# Patient Record
Sex: Female | Born: 1940 | Race: Black or African American | Hispanic: No | Smoking: Never smoker
Health system: Southern US, Community
[De-identification: ages and names within clinical notes are randomized; demographics above are authoritative.]

## PROBLEM LIST (undated history)

## (undated) DIAGNOSIS — I10 Essential (primary) hypertension: Secondary | ICD-10-CM

## (undated) DIAGNOSIS — F329 Major depressive disorder, single episode, unspecified: Secondary | ICD-10-CM

## (undated) DIAGNOSIS — E785 Hyperlipidemia, unspecified: Secondary | ICD-10-CM

## (undated) DIAGNOSIS — B029 Zoster without complications: Secondary | ICD-10-CM

## (undated) DIAGNOSIS — E119 Type 2 diabetes mellitus without complications: Secondary | ICD-10-CM

## (undated) DIAGNOSIS — M25569 Pain in unspecified knee: Secondary | ICD-10-CM

## (undated) DIAGNOSIS — G8929 Other chronic pain: Secondary | ICD-10-CM

## (undated) DIAGNOSIS — F32A Depression, unspecified: Secondary | ICD-10-CM

---

## 2004-08-20 ENCOUNTER — Ambulatory Visit: Payer: Self-pay | Admitting: Family Medicine

## 2010-01-14 ENCOUNTER — Ambulatory Visit: Payer: Self-pay | Admitting: Family Medicine

## 2018-05-12 ENCOUNTER — Emergency Department
Admission: EM | Admit: 2018-05-12 | Discharge: 2018-05-12 | Disposition: A | Discharge status: Home / Self Care | Payer: Medicare Other | Attending: Emergency Medicine | Admitting: Emergency Medicine

## 2018-05-12 ENCOUNTER — Other Ambulatory Visit: Payer: Self-pay

## 2018-05-12 ENCOUNTER — Encounter: Payer: Self-pay | Admitting: Emergency Medicine

## 2018-05-12 DIAGNOSIS — E119 Type 2 diabetes mellitus without complications: Secondary | ICD-10-CM | POA: Insufficient documentation

## 2018-05-12 DIAGNOSIS — Y998 Other external cause status: Secondary | ICD-10-CM | POA: Diagnosis not present

## 2018-05-12 DIAGNOSIS — Z79899 Other long term (current) drug therapy: Secondary | ICD-10-CM | POA: Insufficient documentation

## 2018-05-12 DIAGNOSIS — T300 Burn of unspecified body region, unspecified degree: Secondary | ICD-10-CM

## 2018-05-12 DIAGNOSIS — F329 Major depressive disorder, single episode, unspecified: Secondary | ICD-10-CM | POA: Diagnosis not present

## 2018-05-12 DIAGNOSIS — I10 Essential (primary) hypertension: Secondary | ICD-10-CM | POA: Diagnosis not present

## 2018-05-12 DIAGNOSIS — T24012A Burn of unspecified degree of left thigh, initial encounter: Secondary | ICD-10-CM | POA: Diagnosis present

## 2018-05-12 DIAGNOSIS — E860 Dehydration: Secondary | ICD-10-CM

## 2018-05-12 DIAGNOSIS — T24111A Burn of first degree of right thigh, initial encounter: Secondary | ICD-10-CM | POA: Diagnosis not present

## 2018-05-12 DIAGNOSIS — Y929 Unspecified place or not applicable: Secondary | ICD-10-CM | POA: Diagnosis not present

## 2018-05-12 DIAGNOSIS — T24211A Burn of second degree of right thigh, initial encounter: Secondary | ICD-10-CM | POA: Diagnosis not present

## 2018-05-12 DIAGNOSIS — Y9389 Activity, other specified: Secondary | ICD-10-CM | POA: Diagnosis not present

## 2018-05-12 DIAGNOSIS — R627 Adult failure to thrive: Secondary | ICD-10-CM

## 2018-05-12 DIAGNOSIS — X100XXA Contact with hot drinks, initial encounter: Secondary | ICD-10-CM | POA: Insufficient documentation

## 2018-05-12 DIAGNOSIS — N390 Urinary tract infection, site not specified: Secondary | ICD-10-CM

## 2018-05-12 HISTORY — DX: Type 2 diabetes mellitus without complications: E11.9

## 2018-05-12 HISTORY — DX: Pain in unspecified knee: M25.569

## 2018-05-12 HISTORY — DX: Hyperlipidemia, unspecified: E78.5

## 2018-05-12 HISTORY — DX: Zoster without complications: B02.9

## 2018-05-12 HISTORY — DX: Major depressive disorder, single episode, unspecified: F32.9

## 2018-05-12 HISTORY — DX: Essential (primary) hypertension: I10

## 2018-05-12 HISTORY — DX: Other chronic pain: G89.29

## 2018-05-12 HISTORY — DX: Depression, unspecified: F32.A

## 2018-05-12 LAB — URINALYSIS, COMPLETE (UACMP) WITH MICROSCOPIC
BILIRUBIN URINE: NEGATIVE
Glucose, UA: NEGATIVE mg/dL
HGB URINE DIPSTICK: NEGATIVE
Ketones, ur: NEGATIVE mg/dL
NITRITE: NEGATIVE
PH: 7 (ref 5.0–8.0)
Protein, ur: 100 mg/dL — AB
SPECIFIC GRAVITY, URINE: 1.017 (ref 1.005–1.030)
WBC, UA: >50 WBC/hpf — ABNORMAL HIGH (ref 0–5)

## 2018-05-12 LAB — CBC
HEMATOCRIT: 30 % — AB (ref 35.0–47.0)
Hemoglobin: 9.6 g/dL — ABNORMAL LOW (ref 12.0–16.0)
MCH: 27 pg (ref 26.0–34.0)
MCHC: 31.9 g/dL — ABNORMAL LOW (ref 32.0–36.0)
MCV: 84.5 fL (ref 80.0–100.0)
PLATELETS: 323 10*3/uL (ref 150–440)
RBC: 3.55 MIL/uL — ABNORMAL LOW (ref 3.80–5.20)
RDW: 16.5 % — AB (ref 11.5–14.5)
WBC: 6.9 10*3/uL (ref 3.6–11.0)

## 2018-05-12 LAB — HEPATIC FUNCTION PANEL
ALK PHOS: 79 U/L (ref 38–126)
ALT: 10 U/L (ref 0–44)
AST: 22 U/L (ref 15–41)
Albumin: 2.7 g/dL — ABNORMAL LOW (ref 3.5–5.0)
BILIRUBIN TOTAL: 0.6 mg/dL (ref 0.3–1.2)
Bilirubin, Direct: <0.1 mg/dL (ref 0.0–0.2)
Total Protein: 8 g/dL (ref 6.5–8.1)

## 2018-05-12 LAB — BASIC METABOLIC PANEL
Anion gap: 12 (ref 5–15)
BUN: 38 mg/dL — AB (ref 8–23)
CO2: 27 mmol/L (ref 22–32)
CREATININE: 1.26 mg/dL — AB (ref 0.44–1.00)
Calcium: 8.7 mg/dL — ABNORMAL LOW (ref 8.9–10.3)
Chloride: 106 mmol/L (ref 98–111)
GFR calc Af Amer: 46 mL/min — ABNORMAL LOW (ref >60)
GFR, EST NON AFRICAN AMERICAN: 40 mL/min — AB (ref >60)
Glucose, Bld: 109 mg/dL — ABNORMAL HIGH (ref 70–99)
Potassium: 3.6 mmol/L (ref 3.5–5.1)
SODIUM: 145 mmol/L (ref 135–145)

## 2018-05-12 LAB — TROPONIN I: Troponin I: 0.03 ng/mL (ref <0.03)

## 2018-05-12 LAB — LACTIC ACID, PLASMA: LACTIC ACID, VENOUS: 1.9 mmol/L (ref 0.5–1.9)

## 2018-05-12 LAB — GLUCOSE, CAPILLARY: Glucose-Capillary: 104 mg/dL — ABNORMAL HIGH (ref 70–99)

## 2018-05-12 MED ORDER — SILVER SULFADIAZINE 1 % EX CREA
TOPICAL_CREAM | CUTANEOUS | Status: AC
  Filled 2018-05-12: qty 85

## 2018-05-12 MED ORDER — CEPHALEXIN 250 MG PO CAPS: 250.0000 mg | ORAL_CAPSULE | Freq: Three times a day (TID) | ORAL | 0 refills | Status: AC

## 2018-05-12 MED ORDER — SILVER SULFADIAZINE 1 % EX CREA
TOPICAL_CREAM | Freq: Once | CUTANEOUS | Status: AC
  Administered 2018-05-12: 19:00:00 via TOPICAL

## 2018-05-12 MED ORDER — ATORVASTATIN CALCIUM 40 MG PO TABS: 40.0000 mg | ORAL_TABLET | Freq: Every day | ORAL | 1 refills | Status: AC

## 2018-05-12 MED ORDER — SODIUM CHLORIDE 0.9 % IV SOLN
1.0000 g | INTRAVENOUS | Status: AC
  Administered 2018-05-12: 1 g via INTRAVENOUS
  Filled 2018-05-12: qty 10

## 2018-05-12 MED ORDER — HYDRALAZINE HCL 50 MG PO TABS
25.0000 mg | ORAL_TABLET | Freq: Once | ORAL | Status: AC
  Administered 2018-05-12: 25 mg via ORAL
  Filled 2018-05-12: qty 1

## 2018-05-12 MED ORDER — NIFEDIPINE ER 90 MG PO TB24: 90.0000 mg | ORAL_TABLET | Freq: Every day | ORAL | 1 refills | Status: AC

## 2018-05-12 MED ORDER — SODIUM CHLORIDE 0.9 % IV BOLUS
500.0000 mL | Freq: Once | INTRAVENOUS | Status: AC
  Administered 2018-05-12: 500 mL via INTRAVENOUS

## 2018-05-12 MED ORDER — NIFEDIPINE ER OSMOTIC RELEASE 30 MG PO TB24
90.0000 mg | ORAL_TABLET | Freq: Once | ORAL | Status: AC
  Administered 2018-05-12: 90 mg via ORAL
  Filled 2018-05-12: qty 1
  Filled 2018-05-12: qty 3

## 2018-05-12 MED ORDER — HYDRALAZINE HCL 25 MG PO TABS: 25.0000 mg | ORAL_TABLET | Freq: Two times a day (BID) | ORAL | 1 refills | Status: AC

## 2018-05-12 NOTE — ED Notes (Signed)
Pt is resting in bed with family at bedside. Respirations even/unlabored. nadn at this time.
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
## 2018-05-12 NOTE — ED Notes (Signed)
Pt also reports  Weakness and  Decreased appetite  Denies any chest pain
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
## 2018-05-12 NOTE — ED Notes (Signed)
Dr Vincent Peyer notified of hypertension

## 2018-05-12 NOTE — ED Notes (Signed)
DR Karle Starch NOTIFIED OF ELEVATED TROPONIN

## 2018-05-14 LAB — URINE CULTURE
Culture: 50000 — AB
Special Requests: NORMAL

## 2018-06-16 ENCOUNTER — Other Ambulatory Visit: Payer: Self-pay | Admitting: Family Medicine

## 2018-06-16 DIAGNOSIS — M25511 Pain in right shoulder: Secondary | ICD-10-CM

## 2018-06-23 ENCOUNTER — Ambulatory Visit
Admission: RE | Admit: 2018-06-23 | Discharge: 2018-06-23 | Disposition: A | Discharge status: Home / Self Care | Payer: Medicare Other | Source: Ambulatory Visit | Attending: Family Medicine | Admitting: Family Medicine

## 2018-06-23 DIAGNOSIS — M25511 Pain in right shoulder: Secondary | ICD-10-CM | POA: Insufficient documentation

## 2018-06-23 DIAGNOSIS — M19011 Primary osteoarthritis, right shoulder: Secondary | ICD-10-CM | POA: Insufficient documentation

## 2018-08-08 ENCOUNTER — Emergency Department: Payer: Medicare Other

## 2018-08-08 ENCOUNTER — Encounter: Payer: Self-pay | Admitting: Emergency Medicine

## 2018-08-08 ENCOUNTER — Other Ambulatory Visit: Payer: Self-pay

## 2018-08-08 ENCOUNTER — Inpatient Hospital Stay
Admission: EM | Admit: 2018-08-08 | Discharge: 2018-08-30 | DRG: 064 | Disposition: E | Payer: Medicare Other | Attending: Pulmonary Disease | Admitting: Pulmonary Disease

## 2018-08-08 DIAGNOSIS — W06XXXA Fall from bed, initial encounter: Secondary | ICD-10-CM | POA: Diagnosis present

## 2018-08-08 DIAGNOSIS — R29716 NIHSS score 16: Secondary | ICD-10-CM | POA: Diagnosis not present

## 2018-08-08 DIAGNOSIS — R918 Other nonspecific abnormal finding of lung field: Secondary | ICD-10-CM | POA: Diagnosis not present

## 2018-08-08 DIAGNOSIS — R001 Bradycardia, unspecified: Secondary | ICD-10-CM | POA: Diagnosis not present

## 2018-08-08 DIAGNOSIS — Z681 Body mass index (BMI) 19 or less, adult: Secondary | ICD-10-CM

## 2018-08-08 DIAGNOSIS — I639 Cerebral infarction, unspecified: Secondary | ICD-10-CM | POA: Diagnosis present

## 2018-08-08 DIAGNOSIS — F329 Major depressive disorder, single episode, unspecified: Secondary | ICD-10-CM | POA: Diagnosis present

## 2018-08-08 DIAGNOSIS — D491 Neoplasm of unspecified behavior of respiratory system: Secondary | ICD-10-CM

## 2018-08-08 DIAGNOSIS — R9401 Abnormal electroencephalogram [EEG]: Secondary | ICD-10-CM | POA: Diagnosis present

## 2018-08-08 DIAGNOSIS — I671 Cerebral aneurysm, nonruptured: Secondary | ICD-10-CM | POA: Diagnosis present

## 2018-08-08 DIAGNOSIS — R29719 NIHSS score 19: Secondary | ICD-10-CM | POA: Diagnosis present

## 2018-08-08 DIAGNOSIS — Z8679 Personal history of other diseases of the circulatory system: Secondary | ICD-10-CM

## 2018-08-08 DIAGNOSIS — R29724 NIHSS score 24: Secondary | ICD-10-CM | POA: Diagnosis not present

## 2018-08-08 DIAGNOSIS — C781 Secondary malignant neoplasm of mediastinum: Secondary | ICD-10-CM | POA: Diagnosis present

## 2018-08-08 DIAGNOSIS — Z66 Do not resuscitate: Secondary | ICD-10-CM | POA: Diagnosis not present

## 2018-08-08 DIAGNOSIS — D649 Anemia, unspecified: Secondary | ICD-10-CM | POA: Diagnosis present

## 2018-08-08 DIAGNOSIS — R059 Cough, unspecified: Secondary | ICD-10-CM

## 2018-08-08 DIAGNOSIS — J9601 Acute respiratory failure with hypoxia: Secondary | ICD-10-CM | POA: Diagnosis not present

## 2018-08-08 DIAGNOSIS — E43 Unspecified severe protein-calorie malnutrition: Secondary | ICD-10-CM

## 2018-08-08 DIAGNOSIS — Z823 Family history of stroke: Secondary | ICD-10-CM

## 2018-08-08 DIAGNOSIS — L89152 Pressure ulcer of sacral region, stage 2: Secondary | ICD-10-CM | POA: Diagnosis present

## 2018-08-08 DIAGNOSIS — I6200 Nontraumatic subdural hemorrhage, unspecified: Secondary | ICD-10-CM | POA: Diagnosis present

## 2018-08-08 DIAGNOSIS — C3492 Malignant neoplasm of unspecified part of left bronchus or lung: Secondary | ICD-10-CM | POA: Diagnosis present

## 2018-08-08 DIAGNOSIS — M19011 Primary osteoarthritis, right shoulder: Secondary | ICD-10-CM | POA: Diagnosis present

## 2018-08-08 DIAGNOSIS — E785 Hyperlipidemia, unspecified: Secondary | ICD-10-CM | POA: Diagnosis present

## 2018-08-08 DIAGNOSIS — R569 Unspecified convulsions: Secondary | ICD-10-CM | POA: Diagnosis not present

## 2018-08-08 DIAGNOSIS — R29898 Other symptoms and signs involving the musculoskeletal system: Secondary | ICD-10-CM

## 2018-08-08 DIAGNOSIS — S065XAA Traumatic subdural hemorrhage with loss of consciousness status unknown, initial encounter: Secondary | ICD-10-CM

## 2018-08-08 DIAGNOSIS — R05 Cough: Secondary | ICD-10-CM

## 2018-08-08 DIAGNOSIS — E860 Dehydration: Secondary | ICD-10-CM | POA: Diagnosis present

## 2018-08-08 DIAGNOSIS — Z515 Encounter for palliative care: Secondary | ICD-10-CM

## 2018-08-08 DIAGNOSIS — C7801 Secondary malignant neoplasm of right lung: Secondary | ICD-10-CM | POA: Diagnosis present

## 2018-08-08 DIAGNOSIS — R531 Weakness: Secondary | ICD-10-CM

## 2018-08-08 DIAGNOSIS — Z4659 Encounter for fitting and adjustment of other gastrointestinal appliance and device: Secondary | ICD-10-CM

## 2018-08-08 DIAGNOSIS — C3491 Malignant neoplasm of unspecified part of right bronchus or lung: Secondary | ICD-10-CM | POA: Diagnosis present

## 2018-08-08 DIAGNOSIS — G8191 Hemiplegia, unspecified affecting right dominant side: Secondary | ICD-10-CM | POA: Diagnosis present

## 2018-08-08 DIAGNOSIS — J90 Pleural effusion, not elsewhere classified: Secondary | ICD-10-CM | POA: Diagnosis present

## 2018-08-08 DIAGNOSIS — Z8249 Family history of ischemic heart disease and other diseases of the circulatory system: Secondary | ICD-10-CM

## 2018-08-08 DIAGNOSIS — R579 Shock, unspecified: Secondary | ICD-10-CM | POA: Diagnosis not present

## 2018-08-08 DIAGNOSIS — I10 Essential (primary) hypertension: Secondary | ICD-10-CM | POA: Diagnosis present

## 2018-08-08 DIAGNOSIS — N390 Urinary tract infection, site not specified: Secondary | ICD-10-CM | POA: Diagnosis present

## 2018-08-08 DIAGNOSIS — S065X9A Traumatic subdural hemorrhage with loss of consciousness of unspecified duration, initial encounter: Secondary | ICD-10-CM

## 2018-08-08 DIAGNOSIS — R4182 Altered mental status, unspecified: Secondary | ICD-10-CM | POA: Diagnosis present

## 2018-08-08 DIAGNOSIS — C7802 Secondary malignant neoplasm of left lung: Secondary | ICD-10-CM | POA: Diagnosis present

## 2018-08-08 DIAGNOSIS — Z79899 Other long term (current) drug therapy: Secondary | ICD-10-CM

## 2018-08-08 DIAGNOSIS — L8990 Pressure ulcer of unspecified site, unspecified stage: Secondary | ICD-10-CM

## 2018-08-08 DIAGNOSIS — R29810 Facial weakness: Secondary | ICD-10-CM | POA: Diagnosis present

## 2018-08-08 DIAGNOSIS — R4701 Aphasia: Secondary | ICD-10-CM | POA: Diagnosis present

## 2018-08-08 DIAGNOSIS — I469 Cardiac arrest, cause unspecified: Secondary | ICD-10-CM | POA: Diagnosis not present

## 2018-08-08 DIAGNOSIS — Z993 Dependence on wheelchair: Secondary | ICD-10-CM

## 2018-08-08 DIAGNOSIS — I361 Nonrheumatic tricuspid (valve) insufficiency: Secondary | ICD-10-CM | POA: Diagnosis not present

## 2018-08-08 DIAGNOSIS — Z886 Allergy status to analgesic agent status: Secondary | ICD-10-CM

## 2018-08-08 DIAGNOSIS — F0390 Unspecified dementia without behavioral disturbance: Secondary | ICD-10-CM | POA: Diagnosis present

## 2018-08-08 LAB — CBC WITH DIFFERENTIAL/PLATELET
Abs Immature Granulocytes: 0.04 10*3/uL (ref 0.00–0.07)
BASOS PCT: 0 %
Basophils Absolute: 0 10*3/uL (ref 0.0–0.1)
EOS ABS: 0 10*3/uL (ref 0.0–0.5)
Eosinophils Relative: 0 %
HEMATOCRIT: 28.5 % — AB (ref 36.0–46.0)
Hemoglobin: 8.6 g/dL — ABNORMAL LOW (ref 12.0–15.0)
Immature Granulocytes: 1 %
Lymphocytes Relative: 17 %
Lymphs Abs: 1.2 10*3/uL (ref 0.7–4.0)
MCH: 26 pg (ref 26.0–34.0)
MCHC: 30.2 g/dL (ref 30.0–36.0)
MCV: 86.1 fL (ref 80.0–100.0)
MONO ABS: 0.8 10*3/uL (ref 0.1–1.0)
MONOS PCT: 11 %
NEUTROS PCT: 71 %
Neutro Abs: 5.1 10*3/uL (ref 1.7–7.7)
PLATELETS: 388 10*3/uL (ref 150–400)
RBC: 3.31 MIL/uL — ABNORMAL LOW (ref 3.87–5.11)
RDW: 17.2 % — AB (ref 11.5–15.5)
WBC: 7.2 10*3/uL (ref 4.0–10.5)
nRBC: 0 % (ref 0.0–0.2)

## 2018-08-08 LAB — COMPREHENSIVE METABOLIC PANEL
ALK PHOS: 73 U/L (ref 38–126)
ALT: 14 U/L (ref 0–44)
ANION GAP: 11 (ref 5–15)
AST: 27 U/L (ref 15–41)
Albumin: 2.5 g/dL — ABNORMAL LOW (ref 3.5–5.0)
BUN: 31 mg/dL — ABNORMAL HIGH (ref 8–23)
CALCIUM: 8.3 mg/dL — AB (ref 8.9–10.3)
CO2: 24 mmol/L (ref 22–32)
Chloride: 101 mmol/L (ref 98–111)
Creatinine, Ser: 0.84 mg/dL (ref 0.44–1.00)
GFR calc non Af Amer: >60 mL/min (ref >60)
Glucose, Bld: 77 mg/dL (ref 70–99)
Potassium: 3.7 mmol/L (ref 3.5–5.1)
SODIUM: 136 mmol/L (ref 135–145)
TOTAL PROTEIN: 8.1 g/dL (ref 6.5–8.1)
Total Bilirubin: 0.7 mg/dL (ref 0.3–1.2)

## 2018-08-08 LAB — URINALYSIS, COMPLETE (UACMP) WITH MICROSCOPIC
BACTERIA UA: NONE SEEN
Bilirubin Urine: NEGATIVE
Glucose, UA: NEGATIVE mg/dL
Hgb urine dipstick: NEGATIVE
Ketones, ur: 5 mg/dL — AB
Leukocytes, UA: NEGATIVE
Nitrite: NEGATIVE
PH: 6 (ref 5.0–8.0)
Protein, ur: 30 mg/dL — AB
Specific Gravity, Urine: 1.012 (ref 1.005–1.030)

## 2018-08-08 LAB — GLUCOSE, CAPILLARY: Glucose-Capillary: 70 mg/dL (ref 70–99)

## 2018-08-08 LAB — TROPONIN I: Troponin I: <0.03 ng/mL (ref <0.03)

## 2018-08-08 LAB — LACTIC ACID, PLASMA
LACTIC ACID, VENOUS: 1.9 mmol/L (ref 0.5–1.9)
LACTIC ACID, VENOUS: 1.3 mmol/L (ref 0.5–1.9)

## 2018-08-08 MED ORDER — SODIUM CHLORIDE 0.9 % IV BOLUS
500.0000 mL | Freq: Once | INTRAVENOUS | Status: AC
  Administered 2018-08-08: 500 mL via INTRAVENOUS

## 2018-08-08 MED ORDER — SODIUM CHLORIDE 0.9 % IV BOLUS
500.0000 mL | Freq: Once | INTRAVENOUS | Status: AC
  Administered 2018-08-08: 17:00:00 via INTRAVENOUS

## 2018-08-08 MED ORDER — DONEPEZIL HCL 5 MG PO TABS
10.0000 mg | ORAL_TABLET | Freq: Every day | ORAL | Status: DC
  Filled 2018-08-08 (×2): qty 2

## 2018-08-08 MED ORDER — ACETAMINOPHEN 160 MG/5ML PO SOLN
650.0000 mg | ORAL | Status: DC | PRN
  Filled 2018-08-08: qty 20.3

## 2018-08-08 MED ORDER — ACETAMINOPHEN 325 MG PO TABS: 650.0000 mg | ORAL_TABLET | ORAL | Status: DC | PRN

## 2018-08-08 MED ORDER — LEVETIRACETAM IN NACL 1000 MG/100ML IV SOLN
1000.0000 mg | Freq: Once | INTRAVENOUS | Status: AC
  Administered 2018-08-08: 1000 mg via INTRAVENOUS
  Filled 2018-08-08: qty 100

## 2018-08-08 MED ORDER — SODIUM CHLORIDE 0.9 % IV SOLN
INTRAVENOUS | Status: DC
  Administered 2018-08-09 (×3): via INTRAVENOUS

## 2018-08-08 MED ORDER — FENTANYL CITRATE (PF) 100 MCG/2ML IJ SOLN
12.5000 ug | Freq: Once | INTRAMUSCULAR | Status: AC
  Administered 2018-08-08: 12.5 ug via INTRAVENOUS
  Filled 2018-08-08: qty 2

## 2018-08-08 MED ORDER — FERROUS SULFATE 325 (65 FE) MG PO TABS: 325.0000 mg | ORAL_TABLET | Freq: Every day | ORAL | Status: DC

## 2018-08-08 MED ORDER — SODIUM CHLORIDE 0.9 % IV SOLN
500.0000 mg | Freq: Two times a day (BID) | INTRAVENOUS | Status: DC
  Administered 2018-08-09 (×2): 500 mg via INTRAVENOUS
  Filled 2018-08-08: qty 5
  Filled 2018-08-08: qty 525
  Filled 2018-08-08: qty 5
  Filled 2018-08-08: qty 525
  Filled 2018-08-08: qty 5

## 2018-08-08 MED ORDER — IOHEXOL 350 MG/ML SOLN
75.0000 mL | Freq: Once | INTRAVENOUS | Status: AC | PRN
  Administered 2018-08-08: 75 mL via INTRAVENOUS

## 2018-08-08 MED ORDER — SENNOSIDES-DOCUSATE SODIUM 8.6-50 MG PO TABS: 1.0000 | ORAL_TABLET | Freq: Every evening | ORAL | Status: DC | PRN

## 2018-08-08 MED ORDER — SODIUM CHLORIDE 0.9 % IV SOLN: 860.0000 mg | Freq: Once | INTRAVENOUS | Status: DC

## 2018-08-08 MED ORDER — MIRTAZAPINE 15 MG PO TABS: 7.5000 mg | ORAL_TABLET | Freq: Every day | ORAL | Status: DC

## 2018-08-08 MED ORDER — ATORVASTATIN CALCIUM 20 MG PO TABS: 40.0000 mg | ORAL_TABLET | Freq: Every day | ORAL | Status: DC

## 2018-08-08 MED ORDER — ONDANSETRON HCL 4 MG/2ML IJ SOLN
4.0000 mg | Freq: Once | INTRAMUSCULAR | Status: AC
  Administered 2018-08-08: 4 mg via INTRAVENOUS
  Filled 2018-08-08: qty 2

## 2018-08-08 MED ORDER — ACETAMINOPHEN 650 MG RE SUPP: 650.0000 mg | RECTAL | Status: DC | PRN

## 2018-08-08 MED ORDER — STROKE: EARLY STAGES OF RECOVERY BOOK
Freq: Once | Status: AC
  Administered 2018-08-08: 23:00:00
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
## 2018-08-08 NOTE — ED Triage Notes (Signed)
Pt in via [facility] from home.  Per EMS, pt with right side weakness, facial droop, aphasic.  Family reports last known well time 2030 last night, but also reported that she was unable to feed herself which is not her baseline.  Pt unable to communicate upon arrival.  Vitals WDL.  EDP to bedside.

## 2018-08-08 NOTE — H&P (Addendum)
[facility] NAME: Michaela Dunn    MR#:  [id number]  DATE OF BIRTH:  Oct 17, 1940  DATE OF ADMISSION:  08/10/2018  PRIMARY CARE PHYSICIAN: Marguerita Merles, MD   REQUESTING/REFERRING PHYSICIAN:   CHIEF COMPLAINT:   Chief Complaint  Patient presents with  . Cerebrovascular Accident    HISTORY OF PRESENT ILLNESS: Michaela Dunn  is a 77 y.o. female with a known history of diabetes, hypertension, hyperlipidemia. Michaela Dunn is not able to provide history, due to altered mental status.  Information was taken from reviewing the medical records and from discussion with emergency room physician and the family. Patient was brought to emergency room for altered mental status and right-sided weakness and a fall noted by the family today.  However, it is unclear when the symptoms actually started.  She was last seen well yesterday, although she has been noted with generalized weakness in the past 2 days.  On further questioning, the family admits that patient has lost 20 to 40 pounds in the past 6 months, due to poor appetite. Blood test done emergency room revealed low hemoglobin level at 8.6.  Albumin is 2.5.  Lactic acid level is 1.9. CTA of the head and neck shows: Negative for large vessel occlusion. But positive a for 4-5 mm left convexity Subdural Hematoma, which might explain an attenuated appearance of the left MCA branches - and could be a stroke mimic for symptoms in that hemisphere. 2. Positive also for partially visible bulky tumor in the mediastinum and bilateral pulmonary metastatic disease with large layering left pleural effusion. Lung cancer including small cell carcinoma would be a leading consideration. No strong evidence of metastatic disease to the brain. 3. 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 This patient has previously had a contralateral distal left ICA aneurysm clipped with no adverse features  identified there. 4. 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 And up to moderate stenosis of the left ICA siphon and both distal vertebral arteries.  Is admitted for further evaluation and treatment.  PAST MEDICAL HISTORY:   Past Medical History:  Diagnosis Date  . Depression   . Diabetes mellitus without complication ([place])   . Herpes zoster   . Hyperlipidemia   . Hypertension   . Knee pain, chronic     PAST SURGICAL HISTORY: History reviewed. No pertinent surgical history.  SOCIAL HISTORY:  Social History   Tobacco Use  . Smoking status: Never Smoker  . Smokeless tobacco: Never Used  Substance Use Topics  . Alcohol use: Not on file    FAMILY HISTORY: No family history on file.  DRUG ALLERGIES:  Allergies  Allergen Reactions  . Nsaids     REVIEW OF SYSTEMS:   Unable to obtain, due to patient's altered mental status.  MEDICATIONS AT HOME:  Prior to Admission medications   Medication Sig Start Date End Date Taking? Authorizing Provider  atorvastatin (LIPITOR) 40 MG tablet Take 1 tablet (40 mg total) by mouth daily. 05/12/18 08/10/2018 Yes Hinda Kehr, MD  donepezil (ARICEPT) 10 MG tablet Take 10 mg by mouth at bedtime. 07/19/18  Yes [provider]  FEROSUL 325 (65 Fe) MG tablet Take 325 mg by mouth daily. 07/13/18  Yes [provider]  hydrALAZINE (APRESOLINE) 25 MG tablet  Take 1 tablet (25 mg total) by mouth 2 (two) times daily. 05/12/18 08/05/2018 Yes Hinda Kehr, MD  lisinopril-hydrochlorothiazide (PRINZIDE,ZESTORETIC) 20-12.5 MG tablet Take 1 tablet by mouth daily. 07/13/18  Yes [provider]  mirtazapine (REMERON) 7.5 MG tablet Take 7.5 mg by mouth at bedtime. 07/19/18  Yes [provider]  NIFEdipine (ADALAT CC) 90 MG 24 hr tablet Take 1 tablet (90 mg  total) by mouth daily. 05/12/18 08/02/2018 Yes Hinda Kehr, MD      PHYSICAL EXAMINATION:   VITAL SIGNS: Blood pressure (!) 107/50, pulse 87, temperature 98.8 F (37.1 C), temperature source Rectal, resp. rate 17, height 5\' 3"  (1.6 m), weight 43.1 kg, SpO2 100 %.  GENERAL:  77 y.o.-year-old patient lying in the bed with no acute distress.  She looks lethargic, confused and cachectic. EYES: Pupils equal, round, reactive to light and accommodation. No scleral icterus.  HEENT: Head atraumatic, normocephalic. Oropharynx and nasopharynx clear.  NECK:  Supple, no jugular venous distention. No thyroid enlargement, no tenderness.  LUNGS: Reduced breath sounds bilaterally, no wheezing, rales,rhonchi or crepitation. No use of accessory muscles of respiration.  CARDIOVASCULAR: S1, S2 normal. No S3/S4.  ABDOMEN: Soft, nontender, nondistended. Bowel sounds present. No organomegaly or mass.  EXTREMITIES: No pedal edema, cyanosis, or clubbing.  NEUROLOGIC EXAM is limited, due to patient's altered mental status.  Aphasia as noted.  No facial droop.  Right-sided weakness is noted. PSYCHIATRIC: The patient is alert, but confused, lethargic.  SKIN: No obvious rash.  Stage II sacral ulcer is present.   LABORATORY PANEL:   CBC Recent Labs  Lab 08/16/2018 1413  WBC 7.2  HGB 8.6*  HCT 28.5*  PLT 388  MCV 86.1  MCH 26.0  MCHC 30.2  RDW 17.2*  LYMPHSABS 1.2  MONOABS 0.8  EOSABS 0.0  BASOSABS 0.0   ------------------------------------------------------------------------------------------------------------------  Chemistries  Recent Labs  Lab 07/30/2018 1413  NA 136  K 3.7  CL 101  CO2 24  GLUCOSE 77  BUN 31*  CREATININE 0.84  CALCIUM 8.3*  AST 27  ALT 14  ALKPHOS 73  BILITOT 0.7   ------------------------------------------------------------------------------------------------------------------ estimated creatinine clearance is 38.2 mL/min (by C-G formula based on SCr of 0.84  mg/dL). ------------------------------------------------------------------------------------------------------------------ No results for input(s): TSH, T4TOTAL, T3FREE, THYROIDAB in the last 72 hours.  Invalid input(s): FREET3   Coagulation profile No results for input(s): INR, PROTIME in the last 168 hours. ------------------------------------------------------------------------------------------------------------------- No results for input(s): DDIMER in the last 72 hours. -------------------------------------------------------------------------------------------------------------------  Cardiac Enzymes Recent Labs  Lab 08/26/2018 1413  TROPONINI <0.03   ------------------------------------------------------------------------------------------------------------------ Invalid input(s): POCBNP  ---------------------------------------------------------------------------------------------------------------  Urinalysis    Component Value Date/Time   COLORURINE YELLOW (A) 08/13/2018 1413   APPEARANCEUR CLEAR (A) 08/05/2018 1413   LABSPEC 1.012 08/23/2018 1413   PHURINE 6.0 08/25/2018 1413   GLUCOSEU NEGATIVE 07/30/2018 1413   HGBUR NEGATIVE 08/18/2018 1413   BILIRUBINUR NEGATIVE 08/04/2018 1413   KETONESUR 5 (A) 08/25/2018 1413   PROTEINUR 30 (A) 08/24/2018 1413   NITRITE NEGATIVE 08/06/2018 1413   LEUKOCYTESUR NEGATIVE 08/10/2018 1413     RADIOLOGY: Ct Angio Head W Or Wo Contrast  Result Date: 08/20/2018 CLINICAL DATA:  77 year old female with right side weakness, facial droop and loss of speech. Last known well 2030 hours yesterday. EXAM: CT ANGIOGRAPHY HEAD AND NECK TECHNIQUE: Multidetector CT imaging of the head and neck was performed using the standard protocol during bolus administration of intravenous contrast. Multiplanar CT image reconstructions and MIPs were obtained to evaluate the vascular anatomy. Carotid  stenosis measurements (when applicable) are obtained  utilizing NASCET criteria, using the distal internal carotid diameter as the denominator. CONTRAST:  27mL OMNIPAQUE IOHEXOL 350 MG/ML SOLN COMPARISON:  Head CT without contrast 1439 hours. FINDINGS: CTA NECK Skeleton: Degenerative changes in the cervical spine. No destructive osseous lesion or acute osseous abnormality identified. Previous left frontotemporal craniotomy appears related to a left ICA terminus region aneurysm clipping. Upper chest: Abnormal. Partially visible bulky soft tissue tumor in the bilateral mediastinum. Anterior superior tumor on the left measures greater than 7 centimeters largest dimension. Tumor encases the right upper lobe pulmonary artery. Multifocal rounded and lobular bilateral pulmonary nodules suggest metastatic disease, and there is a dominant 3.8 centimeter mass in the posterosuperior right upper lobe. Superimposed moderate to large layering left pleural effusion. Other neck: Asymmetric hyperenhancement of the right submandibular gland is of unclear etiology, but there is no suspicious neck mass or lymphadenopathy identified. Aortic arch: 3 vessel arch configuration. Mild arch atherosclerosis. Right carotid system: Tortuous brachiocephalic artery and proximal right CCA. Mild for age right CCA atherosclerosis. Right ICA origin and bulb soft and calcified plaque resulting in less than 50 % stenosis with respect to the distal vessel. Left carotid system: No left CCA origin stenosis. Mild left CCA atherosclerosis without stenosis. Soft and calcified plaque in the left ICA origin resulting in 60 % stenosis with respect to the distal vessel best seen on series 10, image 108. No additional stenosis in the neck distal to the bulb. Vertebral arteries: Tortuous proximal right subclavian artery without plaque or stenosis. Normal right vertebral artery origin. Patent right vertebral artery to the skull base without stenosis. Mild proximal left subclavian and left vertebral artery origin  atherosclerosis. Mild left vertebral artery origin stenosis. Tortuous left V1 segment. Patent left vertebral artery to the skull base without additional stenosis. CTA HEAD Posterior circulation: Bilateral V4 segment calcified plaque. Mild to moderate distal vertebral artery stenosis bilaterally with patent vertebrobasilar junction. Proximal basilar bulky calcified plaque but only mild stenosis. Patent SCA origins. Normal right PCA origin. The left PCA origin is mildly obscured by streak artifact from the left ICA terminus vascular clip. Still, there is evidence of severe left PCA distal P1 and proximal P2 segment stenosis best seen on series 11, image 20. There is bilateral distal PCA enhancement. Posterior communicating arteries are diminutive or absent. Anterior circulation:  Both ICA siphons are patent. On the right there is moderate calcified plaque with moderate to severe supraclinoid stenosis. 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 The right MCA and ACA origins remain normal. On the left there is moderate calcified plaque resulting in mild to moderate left siphon stenosis. There is a distal left ICA aneurysm clip with no aneurysmal enhancement identified. The left ICA terminus remains patent. The left MCA and ACA origins appear within normal limits. Bilateral ACA branches are within normal limits. Anterior communicating artery is diminutive or absent. Right MCA M1 segment, bifurcation, and right MCA branches are within normal limits. Left MCA M1 segment and left MCA branches appear patent but attenuated with asymmetric appearing left dural venous enhancement and evidence of a small left subdural hematoma (4-5 millimeters in thickness). No left MCA branch occlusion is identified. Venous sinuses: Patent. Anatomic variants: None. Delayed phase: Asymmetric left convexity dural venous enhancement appears  related to the small subdural hematoma. No abnormal parenchymal enhancement identified to suggest intra-axial metastatic disease. Stable gray-white matter  differentiation from earlier today. Review of the MIP images confirms the above findings IMPRESSION: 1. Negative for large vessel occlusion. But positive a for 4-5 mm left convexity Subdural Hematoma, which might explain an attenuated appearance of the left MCA branches - and could be a stroke mimic for symptoms in that hemisphere. 2. Positive also for partially visible bulky tumor in the mediastinum and bilateral pulmonary metastatic disease with large layering left pleural effusion. Lung cancer including small cell carcinoma would be a leading consideration. No strong evidence of metastatic disease to the brain. 3. 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 4. 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 And up to moderate stenosis of the left ICA siphon and both distal vertebral arteries. Salient findings on this exam discussed by telephone with Dr. Joni Fears in the ED on 08/28/2018 at 15:56 . Electronically Signed   By: Genevie Ann M.D.   On: 08/07/2018 16:04   Ct Head Wo Contrast  Addendum Date: 08/11/2018   ADDENDUM REPORT: 08/18/2018 16:01 ADDENDUM: After reviewing the subsequent CTA head neck with Dr. Nevada Crane, neuroradiologist, there is evidence of a small isodense LEFT convexity subdural hematoma on the order of 6 mm in thickness without evidence of midline shift. There is mild effacement of cortical sulci in the LEFT frontal and parietal lobes. IMPRESSION: Small subacute LEFT convexity subdural hematoma without midline shift.  Electronically Signed   By: Evangeline Dakin M.D.   On: 08/17/2018 16:01   Result Date: 08/28/2018 CLINICAL DATA:  77 year old with aphasia, RIGHT-sided weakness and facial droop. Patient last seen normal at approximately 8:30 p.m. last night. Patient's family states that she fell from bed yesterday onto her wheelchair. Patient currently non-communicative. EXAM: CT HEAD WITHOUT CONTRAST TECHNIQUE: Contiguous axial images were obtained from the base of the skull through the vertex without intravenous contrast. COMPARISON:  None. FINDINGS: Brain: Severe head tilt in the gantry accounts for the apparent asymmetry in the cerebral hemispheres. Ventricular system normal in size and appearance for age. Mild age related cortical atrophy and mild deep atrophy. Moderate to severe cerebellar atrophy. Moderate changes of small vessel disease of the white matter diffusely. No mass lesion. No midline shift. No acute hemorrhage or hematoma. No extra-axial fluid collections. No evidence of acute infarction. Vascular: Apparent prior LEFT carotid trifurcation and/or MCA aneurysm with beam hardening streak artifact arising from the aneurysm clip. Moderate to severe BILATERAL carotid siphon atherosclerosis and severe vertebrobasilar atherosclerosis. No hyperdense vessel. Skull: Prior LEFT frontotemporal craniotomy. No skull fracture or other focal osseous abnormality involving the skull. Sinuses/Orbits: Visualized paranasal sinuses, bilateral mastoid air cells and bilateral middle ear cavities well-aerated. Visualized orbits and globes normal in appearance. Rightward gaze. Other: None. IMPRESSION: 1. No acute intracranial abnormality. 2. Mild age related cortical atrophy and moderate chronic microvascular ischemic changes of the white matter. Electronically Signed: By: Evangeline Dakin M.D. On: 08/02/2018 14:54   Ct Angio Neck W And/or Wo Contrast  Result Date: 08/06/2018 CLINICAL DATA:  77 year old female with right side  weakness, facial droop and loss of speech. Last known well 2030 hours yesterday. EXAM: CT ANGIOGRAPHY HEAD AND NECK TECHNIQUE: Multidetector CT imaging of the head and neck was performed using the standard protocol during  bolus administration of intravenous contrast. Multiplanar CT image reconstructions and MIPs were obtained to evaluate the vascular anatomy. Carotid stenosis measurements (when applicable) are obtained utilizing NASCET criteria, using the distal internal carotid diameter as the denominator. CONTRAST:  41mL OMNIPAQUE IOHEXOL 350 MG/ML SOLN COMPARISON:  Head CT without contrast 1439 hours. FINDINGS: CTA NECK Skeleton: Degenerative changes in the cervical spine. No destructive osseous lesion or acute osseous abnormality identified. Previous left frontotemporal craniotomy appears related to a left ICA terminus region aneurysm clipping. Upper chest: Abnormal. Partially visible bulky soft tissue tumor in the bilateral mediastinum. Anterior superior tumor on the left measures greater than 7 centimeters largest dimension. Tumor encases the right upper lobe pulmonary artery. Multifocal rounded and lobular bilateral pulmonary nodules suggest metastatic disease, and there is a dominant 3.8 centimeter mass in the posterosuperior right upper lobe. Superimposed moderate to large layering left pleural effusion. Other neck: Asymmetric hyperenhancement of the right submandibular gland is of unclear etiology, but there is no suspicious neck mass or lymphadenopathy identified. Aortic arch: 3 vessel arch configuration. Mild arch atherosclerosis. Right carotid system: Tortuous brachiocephalic artery and proximal right CCA. Mild for age right CCA atherosclerosis. Right ICA origin and bulb soft and calcified plaque resulting in less than 50 % stenosis with respect to the distal vessel. Left carotid system: No left CCA origin stenosis. Mild left CCA atherosclerosis without stenosis. Soft and calcified plaque in the left  ICA origin resulting in 60 % stenosis with respect to the distal vessel best seen on series 10, image 108. No additional stenosis in the neck distal to the bulb. Vertebral arteries: Tortuous proximal right subclavian artery without plaque or stenosis. Normal right vertebral artery origin. Patent right vertebral artery to the skull base without stenosis. Mild proximal left subclavian and left vertebral artery origin atherosclerosis. Mild left vertebral artery origin stenosis. Tortuous left V1 segment. Patent left vertebral artery to the skull base without additional stenosis. CTA HEAD Posterior circulation: Bilateral V4 segment calcified plaque. Mild to moderate distal vertebral artery stenosis bilaterally with patent vertebrobasilar junction. Proximal basilar bulky calcified plaque but only mild stenosis. Patent SCA origins. Normal right PCA origin. The left PCA origin is mildly obscured by streak artifact from the left ICA terminus vascular clip. Still, there is evidence of severe left PCA distal P1 and proximal P2 segment stenosis best seen on series 11, image 20. There is bilateral distal PCA enhancement. Posterior communicating arteries are diminutive or absent. Anterior circulation:  Both ICA siphons are patent. On the right there is moderate calcified plaque with moderate to severe supraclinoid stenosis. 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 The right MCA and ACA origins remain normal. On the left there is moderate calcified plaque resulting in mild to moderate left siphon stenosis. There is a distal left ICA aneurysm clip with no aneurysmal enhancement identified. The left ICA terminus remains patent. The left MCA and ACA origins appear within normal limits. Bilateral ACA branches are within normal limits. Anterior communicating artery is diminutive or absent. Right MCA M1 segment, bifurcation, and  right MCA branches are within normal limits. Left MCA M1 segment and left MCA branches appear patent but attenuated with asymmetric appearing left dural venous enhancement and evidence of a small left subdural hematoma (4-5 millimeters in thickness). No left MCA branch occlusion is identified. Venous sinuses: Patent. Anatomic variants: None. Delayed phase: Asymmetric left convexity dural venous enhancement appears related  to the small subdural hematoma. No abnormal parenchymal enhancement identified to suggest intra-axial metastatic disease. Stable gray-white matter differentiation from earlier today. Review of the MIP images confirms the above findings IMPRESSION: 1. Negative for large vessel occlusion. But positive a for 4-5 mm left convexity Subdural Hematoma, which might explain an attenuated appearance of the left MCA branches - and could be a stroke mimic for symptoms in that hemisphere. 2. Positive also for partially visible bulky tumor in the mediastinum and bilateral pulmonary metastatic disease with large layering left pleural effusion. Lung cancer including small cell carcinoma would be a leading consideration. No strong evidence of metastatic disease to the brain. 3. 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 4. 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 And up to moderate stenosis of the left ICA siphon and both distal vertebral arteries. Salient findings on this exam discussed by telephone with Dr. Joni Fears in the ED on 08/03/2018 at 15:56 . Electronically Signed   By: Genevie Ann M.D.   On: 08/05/2018 16:04    EKG: Orders placed or performed during the hospital encounter of  07/31/2018  . EKG 12-Lead  . EKG 12-Lead    IMPRESSION AND PLAN:  1.  Acute left side small SDH, measuring approximately 6 mm.  This could possibly cause strokelike symptoms or seizure.  No edema or shift noted.  Tele-neurosurgery and tele-neurology evaluated the patient and the recommendation is for starting IV fluids and antiseizure medication.  Continue to monitor clinically closely.  Allow permissive hypertension.  No surgical intervention needed at this time.  Patient was loaded with Keppra IV.  Will have PT/OT/ST evaluate and treat the patient.  Will check EEG. Will hold any anticoagulants and repeat CT head in 24 hours.  Inpatient neurology consult requested.  2.  Small 84mm aneurysm noted in anterior circulation on right.  Per neurosurgery, given this size and age of patient, no further treatment indicated at this time for this.  3.  Bulky tumor in the mediastinum and bilateral pulmonary metastatic disease is noted as incidental finding per CT Angio of the neck.  This is a new diagnosis.  Will consult oncology for further evaluation and treatment.  4.  Malnutrition.  BMI 16.8.  Albumin is low at 2.5.  Patient has lost 20 to 40 pounds in the past 6 months due to poor appetite.  This likely related to metastatic malignancy.  5.  Normocytic anemia.  Hemoglobin level is currently at 8.6.  This is possibly related to malignancy.  Will check iron studies, B12 and folate levels.  6.  History of hypertension.  Will hold BP meds for now to allow permissive hypertension.  7.  Stage II sacral pressure ulcer, continue management per wound care team.  8.  Hyperlipidemia, on statin.  All the records are reviewed and case discussed with ED provider. Management plans discussed with the patient, family and they are in agreement.  CODE STATUS: Full    TOTAL TIME TAKING CARE OF THIS PATIENT: 50 minutes.    Amelia Jo M.D on 08/12/2018 at 9:43 PM  Between 7am to 6pm - Pager -  [phone]  After 6pm go to [email or web address] - [facility]  [facility] [place] [facility]  [phone]  CC: Primary care physician; Marguerita Merles, MD

## 2018-08-08 NOTE — ED Provider Notes (Signed)
 -----------------------------------------   5:29 PM on 08/17/2018 ----------------------------------------- CTA result discussed with the radiologist.  Shows a small subdural hematoma on the left that may be causing her strokelike symptoms.  Also very concerning his new finding of approximately 7 cm perihilar mass with lung mets evident in the upper lung fields consistent with a metastatic lung cancer.  Also a sizable left pleural effusion.  Case was discussed with the patient and family at bedside.  They would like to pursue further work-up and management at this time.  I will discuss with neurosurgery regarding potential need for transfer to tertiary care center versus admission at this hospital.  ----------------------------------------- 5:41 PM on 08/22/2018 -----------------------------------------  Discussed with neurosurgery Dr. Lacinda Axon who notes that with current findings he does not feel that the subdural is explaining her symptoms, and he does not feel that she would need transfer for advanced neurosurgical care.  He recommends neurology consultation to help guide disposition.  Will page tele-neurology.   ----------------------------------------- 6:46 PM on 08/03/2018 -----------------------------------------  Case discussed with neurology consultant after his evaluation.  Agrees that her symptoms are more severe than would be attributable to the small subdural hematoma.  I have ordered IV Keppra as recommended by neurology.  Plan for hospitalization for further work-up.  Case discussed with hospitalist.     Final diagnoses:  Dehydration  Generalized weakness  Weakness of right upper extremity  SDH (subdural hematoma) (HCC)  Neoplasm of lung      Carrie Mew, MD 08/11/2018 [phone]

## 2018-08-08 NOTE — ED Notes (Signed)
Patient transported to CT 

## 2018-08-08 NOTE — Consult Note (Signed)
   [facility] Tele[facility]urology Consult Services  Date of Service: 08/02/2018  Impression:  Acute onset right weakness and difficulty speaking after a fall - CT head shows L SDH, but the large deficit seems in excess of what would be expected from the relatively small  SDH. DDx L hemisphere stroke vs seizure and Todd's paralysis from the SDH. Recommend keppra load and repeat CT head in 24 hours.   CTA head/neck neg for LVO. 7x7 R ICA aneurysm noted. Recommend neurosurgery consult for aneurysm and SDH.   Recommendations:  Hold antithrombotics CT head in 24 hours. keppra 1000 mg IV x 1 now then 500 mg bid maintenance. LDL a1c tele TTE DVT proph - lovenox permissive htn PT/OT/speech bedside swallow eval Inpatient neuro consult  ---------------------------------------------------------------------  CC: right weakness and aphasia.  History of Present Illness:  77 yo woman s/p fall yesterday with right sided weakness and aphasia, progressive since yesterday. No LOC/convulsion noted.   Diagnostic Testing:  CT head shows L SDH CTA head/neck unremarkable.   Vital Signs:   Vitals:   08/10/2018 1641 08/15/2018 1644  BP:    Pulse: 100   Resp: 20   Temp:    SpO2: (!) 84% 94%     Exam:   RESULT SUMMARY: 19 points NIH Stroke Scale   INPUTS: 1A: Level of consciousness -> 2 = Requires repeated stimulation to arouse 1B: Ask month and age -> 2 = 0 questions right  1C: 'Blink eyes' & 'squeeze hands' -> 2 = Performs 0 tasks 2: Horizontal extraocular movements -> 0 = Normal 3: Visual fields -> 0 = No visual loss 4: Facial palsy -> 1 = Minor paralysis (flat nasolabial fold, smile asymmetry) 5A: Left arm motor drift -> 0 = No drift for 10 seconds 5B: Right arm motor drift -> 4 = No movement 6A: Left leg motor drift -> 0 = No drift for 5 seconds 6B: Right leg motor drift -> 3 = No effort against gravity 7: Limb Ataxia -> 0 = No ataxia 8: Sensation -> 0 = Normal; no sensory  loss 9: Language/aphasia -> 3 = Mute/global aphasia: no usable speech/auditory comprehension 10: Dysarthria -> 2 = Severe dysarthria: unintelligible slurring or out of proportion to dysphasia 11: Extinction/inattention -> 0 = No abnormality   Medical Decision Making:  - Extensive number of diagnosis or management options are considered above.   - Extensive amount of complex data reviewed.   - High risk of complication and/or morbidity or mortality are associated with differential diagnostic considerations above.  - There may be uncertain outcome and increased probability of prolonged functional impairment or high probability of severe prolonged functional impairment associated with some of these differential diagnosis.   Medical Data Reviewed:  1.Data reviewed include clinical labs, radiology,  Medical Tests;   2.Tests results discussed w/performing or interpreting physician;   3.Obtaining/reviewing old medical records;  4.Obtaining case history from another source;  5.Independent review of image, tracing or specimen.    Patient was informed the Neurology Consult would happen via TeleHealth consult by way of interactive audio and video telecommunications and consented to receiving care in this manner.

## 2018-08-08 NOTE — Consult Note (Signed)
Contacted by ED regarding patient and imaging. She arrived with unknown duration of right sided weakness and aphasia. Her CT scan shows a subacute small (57mm) SDH on the left. Given the subacute nature, no emergent intervention needed but do recommend evaluation for seizures by neurology as she would likely benefit from seizure treatment and EEG. Given carotid stenosis, patient also with risk of TIA/stroke but unable to get MRI. If hypotensive, she needs rehydration as this is a risk factor for stroke with the vasculature.   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 Given this size and age of patient, no further treatment indicated at this time for this.   Patient will need further evaluation by neurology as seizure, TIA, or stroke is likely at this time.   [name] [phone]

## 2018-08-09 ENCOUNTER — Inpatient Hospital Stay: Payer: Medicare Other

## 2018-08-09 ENCOUNTER — Encounter: Payer: Self-pay | Admitting: Nurse Practitioner

## 2018-08-09 ENCOUNTER — Inpatient Hospital Stay (HOSPITAL_COMMUNITY)
Admit: 2018-08-09 | Discharge: 2018-08-09 | Disposition: A | Discharge status: Home / Self Care | Payer: Medicare Other | Attending: Internal Medicine | Admitting: Internal Medicine

## 2018-08-09 DIAGNOSIS — J9601 Acute respiratory failure with hypoxia: Secondary | ICD-10-CM

## 2018-08-09 DIAGNOSIS — S065X9A Traumatic subdural hemorrhage with loss of consciousness of unspecified duration, initial encounter: Secondary | ICD-10-CM

## 2018-08-09 DIAGNOSIS — R918 Other nonspecific abnormal finding of lung field: Secondary | ICD-10-CM

## 2018-08-09 DIAGNOSIS — Z515 Encounter for palliative care: Secondary | ICD-10-CM

## 2018-08-09 DIAGNOSIS — I639 Cerebral infarction, unspecified: Principal | ICD-10-CM

## 2018-08-09 DIAGNOSIS — L8990 Pressure ulcer of unspecified site, unspecified stage: Secondary | ICD-10-CM

## 2018-08-09 DIAGNOSIS — D491 Neoplasm of unspecified behavior of respiratory system: Secondary | ICD-10-CM

## 2018-08-09 DIAGNOSIS — E43 Unspecified severe protein-calorie malnutrition: Secondary | ICD-10-CM

## 2018-08-09 DIAGNOSIS — R29898 Other symptoms and signs involving the musculoskeletal system: Secondary | ICD-10-CM

## 2018-08-09 DIAGNOSIS — I361 Nonrheumatic tricuspid (valve) insufficiency: Secondary | ICD-10-CM

## 2018-08-09 LAB — GLUCOSE, CAPILLARY
Glucose-Capillary: 38 mg/dL — CL (ref 70–99)
Glucose-Capillary: 40 mg/dL — CL (ref 70–99)

## 2018-08-09 LAB — LIPID PANEL
Cholesterol: 133 mg/dL (ref 0–200)
HDL: 42 mg/dL (ref >40)
LDL Cholesterol: 78 mg/dL (ref 0–99)
Total CHOL/HDL Ratio: 3.2 RATIO
Triglycerides: 65 mg/dL (ref <150)
VLDL: 13 mg/dL (ref 0–40)

## 2018-08-09 LAB — ECHOCARDIOGRAM COMPLETE
Height: 63 in
Weight: 1520 oz

## 2018-08-09 LAB — IRON AND TIBC
Iron: 22 ug/dL — ABNORMAL LOW (ref 28–170)
Saturation Ratios: 15 % (ref 10.4–31.8)
TIBC: 152 ug/dL — ABNORMAL LOW (ref 250–450)
UIBC: 130 ug/dL

## 2018-08-09 LAB — FOLATE: Folate: 29 ng/mL (ref >5.9)

## 2018-08-09 LAB — HEMOGLOBIN A1C
Hgb A1c MFr Bld: 5.5 % (ref 4.8–5.6)
Mean Plasma Glucose: 111.15 mg/dL

## 2018-08-09 LAB — URINE CULTURE: CULTURE: NO GROWTH

## 2018-08-09 LAB — FERRITIN: Ferritin: 225 ng/mL (ref 11–307)

## 2018-08-09 LAB — VITAMIN B12: Vitamin B-12: 279 pg/mL (ref 180–914)

## 2018-08-09 MED ORDER — DEXTROSE 50 % IV SOLN
INTRAVENOUS | Status: AC
  Filled 2018-08-09: qty 50

## 2018-08-09 MED ORDER — MIDAZOLAM HCL 2 MG/2ML IJ SOLN
2.0000 mg | Freq: Once | INTRAMUSCULAR | Status: AC
  Administered 2018-08-09: 2 mg via INTRAVENOUS

## 2018-08-09 MED ORDER — ORAL CARE MOUTH RINSE
15.0000 mL | Freq: Two times a day (BID) | OROMUCOSAL | Status: DC
  Administered 2018-08-09: 15 mL via OROMUCOSAL

## 2018-08-09 MED ORDER — LORAZEPAM 2 MG/ML IJ SOLN: 1.0000 mg | INTRAMUSCULAR | Status: DC | PRN

## 2018-08-09 MED ORDER — MIDAZOLAM HCL 2 MG/2ML IJ SOLN
INTRAMUSCULAR | Status: AC
  Filled 2018-08-09: qty 2

## 2018-08-09 MED ORDER — FENTANYL CITRATE (PF) 100 MCG/2ML IJ SOLN
100.0000 ug | Freq: Once | INTRAMUSCULAR | Status: AC
  Administered 2018-08-09: 100 ug via INTRAVENOUS

## 2018-08-09 MED ORDER — LORAZEPAM 2 MG/ML IJ SOLN
INTRAMUSCULAR | Status: AC
  Filled 2018-08-09: qty 1

## 2018-08-09 MED ORDER — NALOXONE HCL 0.4 MG/ML IJ SOLN
INTRAMUSCULAR | Status: AC
  Filled 2018-08-09: qty 1

## 2018-08-09 MED ORDER — DOPAMINE-DEXTROSE 3.2-5 MG/ML-% IV SOLN
0.0000 ug/kg/min | INTRAVENOUS | Status: DC
  Administered 2018-08-09: 25 ug/kg/min via INTRAVENOUS

## 2018-08-09 MED ORDER — FENTANYL CITRATE (PF) 100 MCG/2ML IJ SOLN
INTRAMUSCULAR | Status: AC
  Filled 2018-08-09: qty 2

## 2018-08-10 LAB — GLUCOSE, CAPILLARY: Glucose-Capillary: 209 mg/dL — ABNORMAL HIGH (ref 70–99)

## 2018-08-10 MED FILL — Medication: Qty: 2 | Status: AC

## 2018-08-13 LAB — CULTURE, BLOOD (ROUTINE X 2)
CULTURE: NO GROWTH
Culture: NO GROWTH
Special Requests: ADEQUATE

## 2018-08-30 NOTE — Progress Notes (Signed)
OT Cancellation Note  Patient Details Name: Michaela Dunn MRN: [id number] DOB: 1941-01-28   Cancelled Treatment:    Reason Eval/Treat Not Completed: Other (comment). Consult received, chart reviewed. Per Neurosurgery note: recommending further neurology follow up for evaluation for seizure, TIA, or stroke. Pt pending repeat head CT. Will hold OT until repeat CT has been performed to ensure stability of SDH and neurology has followed up and POC has been updated.  Jeni Salles, MPH, MS, OTR/L ascom [phone] Aug 22, 2018, 10:01 AM

## 2018-08-30 NOTE — Progress Notes (Signed)
Chaplain responded to a Rapid Response. Pt was in critial care. Daughter ws at the bedside. Chaplain practiced pastoral presence while team assisted Pt. [employer] called. Chaplain stood with daughter as team did CPR on the Pt while daughter held her hand. Pt was resuscitated and moved to [place]. Chaplain and daughter went to [place] and care team came to retreive and told the Pt Coded again. Chaplain stood with daughter and prayed with family and stood with her as she called family. Chaplain left and allowed daughter time to be with her mother alone. Chaplain will check back   08-13-2018 2000  Clinical Encounter Type  Visited With Patient and family together  Visit Type Code;Critical Care  Referral From Care management  Spiritual Encounters  Spiritual Needs Prayer;Emotional

## 2018-08-30 NOTE — Death Summary Note (Signed)
12/ DEATH SUMMARY   Patient Details  Name: Michaela Dunn MRN: [id number] DOB: [birth year]  Admission/Discharge Information   Admit Date:  2018-09-04  Date of Death:  09/06/11  Time of Death:  21:46  Length of Stay: 1  Referring Physician: Marguerita Merles, MD   Reason(s) for Hospitalization  Altered mental status with right-sided weakness.  Diagnoses  Preliminary cause of death: Cardiac arrest [facility]) Secondary Diagnoses (including complications and co-morbidities):  Active Problems:   Acute CVA (cerebrovascular accident) ([place])   Pressure injury of skin   Palliative care encounter   Neoplasm of lung   Protein-calorie malnutrition, severe   Brief Hospital Course (including significant findings, care, treatment, and services provided and events leading to death)  Michaela Dunn is a 78 y.o. year old female who presented to [facility] due to altered mental status, right-sided weakness and 40 pound weight loss. Patient is a very cachectic 78 year old brought to the intensive care unit after CODE BLUE.  The patient had a second Ucon after arrival to the ICU.  Appears she had a seizure on the floor and was intubated for airway protection afterwards developed severe bradycardia.  Subsequently developed PEA.  CODE BLUE situation activated.  Review of the notes from the day showed that the patient has been for the most part pretty unresponsive.  The patient presented initially on 09-05-2023 with altered mental status.  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  She is noted to be severely malnourished and the records has obtained substantial weight loss.  Post second CODE BLUE discussions were had with the patient's daughter patient is now DNR which is appropriate patient has remained unresponsive throughout the code and appears to have agonal respirations on the ventilator.   Blood pressure is not responding to pressors.    Pertinent Labs and Studies  Significant Diagnostic Studies Ct Angio Head W Or Wo Contrast  Result Date: 09-04-2018 CLINICAL DATA:  78 year old female with right side weakness, facial droop and loss of speech. Last known well 2030 hours yesterday. EXAM: CT ANGIOGRAPHY HEAD AND NECK TECHNIQUE: Multidetector CT imaging of the head and neck was performed using the standard protocol during bolus administration of intravenous contrast. Multiplanar CT image reconstructions and MIPs were obtained to evaluate the vascular anatomy. Carotid stenosis measurements (when applicable) are obtained utilizing NASCET criteria, using the distal internal carotid diameter as the denominator. CONTRAST:  33mL OMNIPAQUE IOHEXOL 350 MG/ML SOLN COMPARISON:  Head CT without contrast 1439 hours. FINDINGS: CTA NECK Skeleton: Degenerative changes in the cervical spine. No destructive osseous lesion or acute osseous abnormality identified. Previous left frontotemporal craniotomy appears related to a left ICA terminus region aneurysm clipping. Upper chest: Abnormal. Partially visible bulky soft tissue tumor in the bilateral mediastinum. Anterior superior tumor on the left measures greater than 7 centimeters largest dimension. Tumor encases the right upper lobe pulmonary artery. Multifocal rounded and lobular bilateral pulmonary nodules suggest metastatic disease, and there is a dominant 3.8 centimeter mass in the posterosuperior right upper lobe. Superimposed moderate to large layering left pleural effusion. Other neck: Asymmetric hyperenhancement of the right submandibular gland is of unclear etiology, but there is no suspicious neck mass or lymphadenopathy identified. Aortic arch: 3 vessel arch configuration. Mild arch atherosclerosis. Right carotid system: Tortuous brachiocephalic artery and proximal right CCA. Mild for age right CCA atherosclerosis. Right ICA origin and bulb soft and  calcified plaque resulting in less than 50 % stenosis with respect to the distal vessel. Left carotid system: No left CCA origin stenosis. Mild left CCA atherosclerosis without stenosis. Soft and calcified plaque in the left ICA origin resulting in 60 % stenosis with respect to the distal vessel best seen on series 10, image 108. No additional stenosis in the neck distal to the bulb. Vertebral arteries: Tortuous proximal right subclavian artery without plaque or stenosis. Normal right vertebral artery origin. Patent right vertebral artery to the skull base without stenosis. Mild proximal left subclavian and left vertebral artery origin atherosclerosis. Mild left vertebral artery origin stenosis. Tortuous left V1 segment. Patent left vertebral artery to the skull base without additional stenosis. CTA HEAD Posterior circulation: Bilateral V4 segment calcified plaque. Mild to moderate distal vertebral artery stenosis bilaterally with patent vertebrobasilar junction. Proximal basilar bulky calcified plaque but only mild stenosis. Patent SCA origins. Normal right PCA origin. The left PCA origin is mildly obscured by streak artifact from the left ICA terminus vascular clip. Still, there is evidence of severe left PCA distal P1 and proximal P2 segment stenosis best seen on series 11, image 20. There is bilateral distal PCA enhancement. Posterior communicating arteries are diminutive or absent. Anterior circulation:  Both ICA siphons are patent. On the right there is moderate calcified plaque with moderate to severe supraclinoid stenosis. 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 The right MCA and ACA origins remain normal. On the left there is moderate calcified plaque resulting in mild to moderate left siphon stenosis. There is a distal left ICA aneurysm clip with no aneurysmal enhancement identified. The left ICA  terminus remains patent. The left MCA and ACA origins appear within normal limits. Bilateral ACA branches are within normal limits. Anterior communicating artery is diminutive or absent. Right MCA M1 segment, bifurcation, and right MCA branches are within normal limits. Left MCA M1 segment and left MCA branches appear patent but attenuated with asymmetric appearing left dural venous enhancement and evidence of a small left subdural hematoma (4-5 millimeters in thickness). No left MCA branch occlusion is identified. Venous sinuses: Patent. Anatomic variants: None. Delayed phase: Asymmetric left convexity dural venous enhancement appears related to the small subdural hematoma. No abnormal parenchymal enhancement identified to suggest intra-axial metastatic disease. Stable gray-white matter differentiation from earlier today. Review of the MIP images confirms the above findings IMPRESSION: 1. Negative for large vessel occlusion. But positive a for 4-5 mm left convexity Subdural Hematoma, which might explain an attenuated appearance of the left MCA branches - and could be a stroke mimic for symptoms in that hemisphere. 2. Positive also for partially visible bulky tumor in the mediastinum and bilateral pulmonary metastatic disease with large layering left pleural effusion. Lung cancer including small cell carcinoma would be a leading consideration. No strong evidence of metastatic disease to the brain. 3. 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 4. 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 And up to moderate stenosis of the left ICA siphon and both distal vertebral arteries. Salient findings on  this exam discussed by telephone with Dr. Joni Fears in the ED  on 08/04/2018 at 15:56 . Electronically Signed   By: Genevie Ann M.D.   On: 08/07/2018 16:04   Dg Abd 1 View  Result Date: Aug 13, 2018 CLINICAL DATA:  Nasogastric tube placement. EXAM: ABDOMEN - 1 VIEW COMPARISON:  Portable chest obtained at the same time. FINDINGS: Nasogastric tube tip in the mid stomach and side hole in the proximal stomach on this view. Gaseous distention of the stomach and small bowel with borderline dilatation of the proximal small bowel and normal caliber distal small bowel. Lumbar and lower thoracic spine degenerative changes. Diffuse osteopenia. IMPRESSION: 1. Nasogastric tube tip in the mid stomach and side hole in the proximal stomach. 2. Minimal proximal small bowel ileus or partial obstruction. Electronically Signed   By: Claudie Revering M.D.   On: August 13, 2018 20:36   Ct Head Wo Contrast  Result Date: 2018/08/13 CLINICAL DATA:  Hemorrhage follow up EXAM: CT HEAD WITHOUT CONTRAST TECHNIQUE: Contiguous axial images were obtained from the base of the skull through the vertex without intravenous contrast. COMPARISON:  CTA head neck 08/29/2018 FINDINGS: Brain: Unchanged 7 mm left convexity subdural hematoma. No mass effect or midline shift. Size and configuration of the ventricles are unchanged. No new site of hemorrhage. White matter findings of chronic microvascular disease. No evidence of acute cortical infarct. Vascular: Previous clipping of left ICA aneurysm. Skull: Remote left pterional craniotomy. Sinuses/Orbits: No fluid levels or advanced mucosal thickening of the visualized paranasal sinuses. No mastoid or middle ear effusion. The orbits are normal. IMPRESSION: Unchanged 7 mm left convexity subdural hematoma without mass effect or midline shift. Electronically Signed   By: Ulyses Jarred M.D.   On: 2018-08-13 18:16   Ct Head Wo Contrast  Addendum Date: 08/13/2018   ADDENDUM REPORT: 08/20/2018 16:01 ADDENDUM: After  reviewing the subsequent CTA head neck with Dr. Nevada Crane, neuroradiologist, there is evidence of a small isodense LEFT convexity subdural hematoma on the order of 6 mm in thickness without evidence of midline shift. There is mild effacement of cortical sulci in the LEFT frontal and parietal lobes. IMPRESSION: Small subacute LEFT convexity subdural hematoma without midline shift. Electronically Signed   By: Evangeline Dakin M.D.   On: 08/16/2018 16:01   Result Date: 07/31/2018 CLINICAL DATA:  78 year old with aphasia, RIGHT-sided weakness and facial droop. Patient last seen normal at approximately 8:30 p.m. last night. Patient's family states that she fell from bed yesterday onto her wheelchair. Patient currently non-communicative. EXAM: CT HEAD WITHOUT CONTRAST TECHNIQUE: Contiguous axial images were obtained from the base of the skull through the vertex without intravenous contrast. COMPARISON:  None. FINDINGS: Brain: Severe head tilt in the gantry accounts for the apparent asymmetry in the cerebral hemispheres. Ventricular system normal in size and appearance for age. Mild age related cortical atrophy and mild deep atrophy. Moderate to severe cerebellar atrophy. Moderate changes of small vessel disease of the white matter diffusely. No mass lesion. No midline shift. No acute hemorrhage or hematoma. No extra-axial fluid collections. No evidence of acute infarction. Vascular: Apparent prior LEFT carotid trifurcation and/or MCA aneurysm with beam hardening streak artifact arising from the aneurysm clip. Moderate to severe BILATERAL carotid siphon atherosclerosis and severe vertebrobasilar atherosclerosis. No hyperdense vessel. Skull: Prior LEFT frontotemporal craniotomy. No skull fracture or other focal osseous abnormality involving the skull. Sinuses/Orbits: Visualized paranasal sinuses, bilateral mastoid air cells and bilateral middle ear cavities well-aerated. Visualized orbits and globes normal in appearance.  Rightward gaze. Other: None. IMPRESSION: 1. No acute intracranial abnormality. 2. Mild age related  cortical atrophy and moderate chronic microvascular ischemic changes of the white matter. Electronically Signed: By: Evangeline Dakin M.D. On: 08/02/2018 14:54   Ct Angio Neck W And/or Wo Contrast  Result Date: 08/19/2018 CLINICAL DATA:  78 year old female with right side weakness, facial droop and loss of speech. Last known well 2030 hours yesterday. EXAM: CT ANGIOGRAPHY HEAD AND NECK TECHNIQUE: Multidetector CT imaging of the head and neck was performed using the standard protocol during bolus administration of intravenous contrast. Multiplanar CT image reconstructions and MIPs were obtained to evaluate the vascular anatomy. Carotid stenosis measurements (when applicable) are obtained utilizing NASCET criteria, using the distal internal carotid diameter as the denominator. CONTRAST:  41mL OMNIPAQUE IOHEXOL 350 MG/ML SOLN COMPARISON:  Head CT without contrast 1439 hours. FINDINGS: CTA NECK Skeleton: Degenerative changes in the cervical spine. No destructive osseous lesion or acute osseous abnormality identified. Previous left frontotemporal craniotomy appears related to a left ICA terminus region aneurysm clipping. Upper chest: Abnormal. Partially visible bulky soft tissue tumor in the bilateral mediastinum. Anterior superior tumor on the left measures greater than 7 centimeters largest dimension. Tumor encases the right upper lobe pulmonary artery. Multifocal rounded and lobular bilateral pulmonary nodules suggest metastatic disease, and there is a dominant 3.8 centimeter mass in the posterosuperior right upper lobe. Superimposed moderate to large layering left pleural effusion. Other neck: Asymmetric hyperenhancement of the right submandibular gland is of unclear etiology, but there is no suspicious neck mass or lymphadenopathy identified. Aortic arch: 3 vessel arch configuration. Mild arch atherosclerosis.  Right carotid system: Tortuous brachiocephalic artery and proximal right CCA. Mild for age right CCA atherosclerosis. Right ICA origin and bulb soft and calcified plaque resulting in less than 50 % stenosis with respect to the distal vessel. Left carotid system: No left CCA origin stenosis. Mild left CCA atherosclerosis without stenosis. Soft and calcified plaque in the left ICA origin resulting in 60 % stenosis with respect to the distal vessel best seen on series 10, image 108. No additional stenosis in the neck distal to the bulb. Vertebral arteries: Tortuous proximal right subclavian artery without plaque or stenosis. Normal right vertebral artery origin. Patent right vertebral artery to the skull base without stenosis. Mild proximal left subclavian and left vertebral artery origin atherosclerosis. Mild left vertebral artery origin stenosis. Tortuous left V1 segment. Patent left vertebral artery to the skull base without additional stenosis. CTA HEAD Posterior circulation: Bilateral V4 segment calcified plaque. Mild to moderate distal vertebral artery stenosis bilaterally with patent vertebrobasilar junction. Proximal basilar bulky calcified plaque but only mild stenosis. Patent SCA origins. Normal right PCA origin. The left PCA origin is mildly obscured by streak artifact from the left ICA terminus vascular clip. Still, there is evidence of severe left PCA distal P1 and proximal P2 segment stenosis best seen on series 11, image 20. There is bilateral distal PCA enhancement. Posterior communicating arteries are diminutive or absent. Anterior circulation:  Both ICA siphons are patent. On the right there is moderate calcified plaque with moderate to severe supraclinoid stenosis. 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 The right MCA and ACA origins remain normal. On the left there is moderate calcified  plaque resulting in mild to moderate left siphon stenosis. There is a distal left ICA aneurysm clip with no aneurysmal enhancement identified. The left ICA terminus remains patent. The left MCA and ACA origins appear within normal limits. Bilateral ACA branches  are within normal limits. Anterior communicating artery is diminutive or absent. Right MCA M1 segment, bifurcation, and right MCA branches are within normal limits. Left MCA M1 segment and left MCA branches appear patent but attenuated with asymmetric appearing left dural venous enhancement and evidence of a small left subdural hematoma (4-5 millimeters in thickness). No left MCA branch occlusion is identified. Venous sinuses: Patent. Anatomic variants: None. Delayed phase: Asymmetric left convexity dural venous enhancement appears related to the small subdural hematoma. No abnormal parenchymal enhancement identified to suggest intra-axial metastatic disease. Stable gray-white matter differentiation from earlier today. Review of the MIP images confirms the above findings IMPRESSION: 1. Negative for large vessel occlusion. But positive a for 4-5 mm left convexity Subdural Hematoma, which might explain an attenuated appearance of the left MCA branches - and could be a stroke mimic for symptoms in that hemisphere. 2. Positive also for partially visible bulky tumor in the mediastinum and bilateral pulmonary metastatic disease with large layering left pleural effusion. Lung cancer including small cell carcinoma would be a leading consideration. No strong evidence of metastatic disease to the brain. 3. 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 4. Additionally there is a 60% atherosclerotic stenosis at the left ICA origin, but more advanced intracranial atherosclerosis including moderate to severe stenoses of the  right ICA supraclinoid segment and the right PCA. And up to moderate stenosis of the left ICA siphon and both distal vertebral arteries. Salient findings on this exam discussed by telephone with Dr. Joni Fears in the ED on 08/27/2018 at 15:56 . Electronically Signed   By: Genevie Ann M.D.   On: 08/21/2018 16:04   US Carotid Bilateral (at [facility] Only)  Result Date: 08/20/2018 CLINICAL DATA:  Acute CVA EXAM: BILATERAL CAROTID DUPLEX ULTRASOUND TECHNIQUE: Pearline Cables scale imaging, color Doppler and duplex ultrasound were performed of bilateral carotid and vertebral arteries in the neck. COMPARISON:  None. FINDINGS: Criteria: Quantification of carotid stenosis is based on velocity parameters that correlate the residual internal carotid diameter with NASCET-based stenosis levels, using the diameter of the distal internal carotid lumen as the denominator for stenosis measurement. The following velocity measurements were obtained: RIGHT ICA: 96 cm/sec CCA: 64 cm/sec SYSTOLIC ICA/CCA RATIO:  1.5 ECA: 50 cm/sec LEFT ICA: 91 cm/sec CCA: 67 cm/sec SYSTOLIC ICA/CCA RATIO:  1.4 ECA: 60 cm/sec RIGHT CAROTID ARTERY: Moderate irregular calcified plaque in the bulb. Low resistance internal carotid Doppler pattern. RIGHT VERTEBRAL ARTERY:  Antegrade. LEFT CAROTID ARTERY: Mild smooth calcified plaque in the bulb. Low resistance internal carotid Doppler pattern is preserved. LEFT VERTEBRAL ARTERY:  Antegrade. IMPRESSION: Less than 50% stenosis in the right and left internal carotid arteries. Electronically Signed   By: Marybelle Killings M.D.   On: 08/20/2018 09:32   Dg Chest Port 1 View  Result Date: 2018-08-20 CLINICAL DATA:  Status post intubation. EXAM: PORTABLE CHEST 1 VIEW COMPARISON:  None. FINDINGS: Endotracheal tube tip 2.2 cm above the carina. Nasogastric tube extending into the stomach with the side hole in the region of the gastroesophageal junction and tip not included. Dense airspace opacity in the medial aspect of the right  upper lobe. Mild patchy airspace opacity in the left mid and lower lung zones. Minimal patchy airspace opacity in the right mid and lower lung zones. No visible pleural fluid. The left lateral costophrenic angle is not included.  Lobulated soft tissue thickening in the mediastinum and hilar region on the left. Thoracic spine degenerative changes. IMPRESSION: 1. Endotracheal tube tip 2.2 cm above the carina. It is recommended that this be retracted 3 cm. 2. Nasogastric tube side hole in the region of the gastroesophageal junction. 3. Bilateral pneumonia, most pronounced in the right upper lobe with some volume loss. This could be an indication of postobstructive changes. 4. Probable mediastinal and left hilar adenopathy. Electronically Signed   By: Claudie Revering M.D.   On: 08/21/18 20:34    Microbiology Recent Results (from the past 240 hour(s))  Urine culture     Status: None   Collection Time: 08/03/2018  2:13 PM  Result Value Ref Range Status   Specimen Description   Final    URINE, CATHETERIZED Performed at [facility], [address] [place], [place] [address]    Special Requests   Final    NONE Performed at [facility], [address], [place], [place] [address]    Culture   Final    NO GROWTH Performed at [facility], [place] [address] [place], [place] [address]    Report Status 08/21/2018 FINAL  Final  Culture, blood (routine x 2)     Status: None (Preliminary result)   Collection Time: 08/07/2018  2:14 PM  Result Value Ref Range Status   Specimen Description BLOOD RIGHT ANTECUBITAL  Final   Special Requests   Final    BOTTLES DRAWN AEROBIC AND ANAEROBIC Blood Culture adequate volume   Culture   Final    NO GROWTH < 24 HOURS Performed at [facility], [address] [place], [place] [address]    Report Status PENDING  Incomplete  Culture, blood (routine x 2)     Status: None (Preliminary result)   Collection Time: 08/05/2018  2:14 PM  Result  Value Ref Range Status   Specimen Description BLOOD BLOOD LEFT FOREARM  Final   Special Requests   Final    BOTTLES DRAWN AEROBIC AND ANAEROBIC Blood Culture results may not be optimal due to an inadequate volume of blood received in culture bottles   Culture   Final    NO GROWTH < 24 HOURS Performed at [facility], [address] [place], [place] [address]    Report Status PENDING  Incomplete    Lab Basic Metabolic Panel: Recent Labs  Lab 07/30/2018 1413  NA 136  K 3.7  CL 101  CO2 24  GLUCOSE 77  BUN 31*  CREATININE 0.84  CALCIUM 8.3*   Liver Function Tests: Recent Labs  Lab 08/28/2018 1413  AST 27  ALT 14  ALKPHOS 73  BILITOT 0.7  PROT 8.1  ALBUMIN 2.5*   No results for input(s): LIPASE, AMYLASE in the last 168 hours. No results for input(s): AMMONIA in the last 168 hours. CBC: Recent Labs  Lab 08/26/2018 1413  WBC 7.2  NEUTROABS 5.1  HGB 8.6*  HCT 28.5*  MCV 86.1  PLT 388   Cardiac Enzymes: Recent Labs  Lab 08/01/2018 1413  TROPONINI <0.03   Sepsis Labs: Recent Labs  Lab 08/23/2018 1413 08/24/2018 2318  WBC 7.2  --   LATICACIDVEN 1.9 1.3    Procedures/Operations  EEG 08/21/2018 Multiple imaging studies as noted above.  Vernard Gambles 08/21/18, 9:51 PM

## 2018-08-30 NOTE — Progress Notes (Signed)
[facility] [place] NAME: Michaela Dunn    MR#:  [id number]  DATE OF BIRTH:  07-28-1941  SUBJECTIVE:  CHIEF COMPLAINT:   Chief Complaint  Patient presents with  . Cerebrovascular Accident    Brought with mental status change, still drowsy, does follow simple commands, but not communicate much. She was noted to have intracranial hemorrhage and a large mediastinal mass  REVIEW OF SYSTEMS:  ROS Pt is not able to give ROS due to mental status. Daughter and son in room.  DRUG ALLERGIES:   Allergies  Allergen Reactions  . Nsaids     VITALS:  Blood pressure (!) 171/80, pulse 78, temperature 98 F (36.7 C), temperature source Oral, resp. rate 18, height 5\' 3"  (1.6 m), weight 43.1 kg, SpO2 98 %.  PHYSICAL EXAMINATION:  GENERAL:  78 y.o.-year-old thin patient lying in the bed with no acute distress.  EYES: Pupils equal, round, reactive to light . No scleral icterus. Extraocular muscles intact.  HEENT: Head atraumatic, normocephalic. Oropharynx and nasopharynx clear.  NECK:  Supple, no jugular venous distention. No thyroid enlargement, no tenderness.  LUNGS: Normal breath sounds bilaterally, no wheezing, rales,rhonchi or crepitation. No use of accessory muscles of respiration.  CARDIOVASCULAR: S1, S2 normal. No murmurs, rubs, or gallops.  ABDOMEN: Soft, nontender, nondistended. Bowel sounds present. No organomegaly or mass.  EXTREMITIES: No pedal edema, cyanosis, or clubbing.  NEUROLOGIC: drowsy, barely opens eyes and follows commands by moving left UL and LL, barely moves fingers. Does not move right side of limbs. PSYCHIATRIC: The patient is Drowsy.  SKIN: No obvious rash, lesion, or ulcer.   Physical Exam LABORATORY PANEL:   CBC Recent Labs  Lab 08/18/2018 1413  WBC 7.2  HGB 8.6*  HCT 28.5*  PLT 388   ------------------------------------------------------------------------------------------------------------------  Chemistries   Recent Labs  Lab 07/30/2018 1413  NA 136  K 3.7  CL 101  CO2 24  GLUCOSE 77  BUN 31*  CREATININE 0.84  CALCIUM 8.3*  AST 27  ALT 14  ALKPHOS 73  BILITOT 0.7   ------------------------------------------------------------------------------------------------------------------  Cardiac Enzymes Recent Labs  Lab 08/12/2018 1413  TROPONINI <0.03   ------------------------------------------------------------------------------------------------------------------  RADIOLOGY:  Ct Angio Head W Or Wo Contrast  Result Date: 08/18/2018 CLINICAL DATA:  78 year old female with right side weakness, facial droop and loss of speech. Last known well 2030 hours yesterday. EXAM: CT ANGIOGRAPHY HEAD AND NECK TECHNIQUE: Multidetector CT imaging of the head and neck was performed using the standard protocol during bolus administration of intravenous contrast. Multiplanar CT image reconstructions and MIPs were obtained to evaluate the vascular anatomy. Carotid stenosis measurements (when applicable) are obtained utilizing NASCET criteria, using the distal internal carotid diameter as the denominator. CONTRAST:  26mL OMNIPAQUE IOHEXOL 350 MG/ML SOLN COMPARISON:  Head CT without contrast 1439 hours. FINDINGS: CTA NECK Skeleton: Degenerative changes in the cervical spine. No destructive osseous lesion or acute osseous abnormality identified. Previous left frontotemporal craniotomy appears related to a left ICA terminus region aneurysm clipping. Upper chest: Abnormal. Partially visible bulky soft tissue tumor in the bilateral mediastinum. Anterior superior tumor on the left measures greater than 7 centimeters largest dimension. Tumor encases the right upper lobe pulmonary artery. Multifocal rounded and lobular bilateral pulmonary nodules suggest metastatic disease, and there is a dominant 3.8 centimeter mass in the posterosuperior right upper lobe. Superimposed moderate to large layering left pleural effusion. Other  neck: Asymmetric hyperenhancement of the right submandibular gland is of unclear etiology, but  there is no suspicious neck mass or lymphadenopathy identified. Aortic arch: 3 vessel arch configuration. Mild arch atherosclerosis. Right carotid system: Tortuous brachiocephalic artery and proximal right CCA. Mild for age right CCA atherosclerosis. Right ICA origin and bulb soft and calcified plaque resulting in less than 50 % stenosis with respect to the distal vessel. Left carotid system: No left CCA origin stenosis. Mild left CCA atherosclerosis without stenosis. Soft and calcified plaque in the left ICA origin resulting in 60 % stenosis with respect to the distal vessel best seen on series 10, image 108. No additional stenosis in the neck distal to the bulb. Vertebral arteries: Tortuous proximal right subclavian artery without plaque or stenosis. Normal right vertebral artery origin. Patent right vertebral artery to the skull base without stenosis. Mild proximal left subclavian and left vertebral artery origin atherosclerosis. Mild left vertebral artery origin stenosis. Tortuous left V1 segment. Patent left vertebral artery to the skull base without additional stenosis. CTA HEAD Posterior circulation: Bilateral V4 segment calcified plaque. Mild to moderate distal vertebral artery stenosis bilaterally with patent vertebrobasilar junction. Proximal basilar bulky calcified plaque but only mild stenosis. Patent SCA origins. Normal right PCA origin. The left PCA origin is mildly obscured by streak artifact from the left ICA terminus vascular clip. Still, there is evidence of severe left PCA distal P1 and proximal P2 segment stenosis best seen on series 11, image 20. There is bilateral distal PCA enhancement. Posterior communicating arteries are diminutive or absent. Anterior circulation:  Both ICA siphons are patent. On the right there is moderate calcified plaque with moderate to severe supraclinoid stenosis. There is a  superimposed 7 x 7 millimeter saccular aneurysm of the right ICA terminus directed posteriorly and slightly inferiorly (right PCOM region). The right MCA and ACA origins remain normal. On the left there is moderate calcified plaque resulting in mild to moderate left siphon stenosis. There is a distal left ICA aneurysm clip with no aneurysmal enhancement identified. The left ICA terminus remains patent. The left MCA and ACA origins appear within normal limits. Bilateral ACA branches are within normal limits. Anterior communicating artery is diminutive or absent. Right MCA M1 segment, bifurcation, and right MCA branches are within normal limits. Left MCA M1 segment and left MCA branches appear patent but attenuated with asymmetric appearing left dural venous enhancement and evidence of a small left subdural hematoma (4-5 millimeters in thickness). No left MCA branch occlusion is identified. Venous sinuses: Patent. Anatomic variants: None. Delayed phase: Asymmetric left convexity dural venous enhancement appears related to the small subdural hematoma. No abnormal parenchymal enhancement identified to suggest intra-axial metastatic disease. Stable gray-white matter differentiation from earlier today. Review of the MIP images confirms the above findings IMPRESSION: 1. Negative for large vessel occlusion. But positive a for 4-5 mm left convexity Subdural Hematoma, which might explain an attenuated appearance of the left MCA branches - and could be a stroke mimic for symptoms in that hemisphere. 2. Positive also for partially visible bulky tumor in the mediastinum and bilateral pulmonary metastatic disease with large layering left pleural effusion. Lung cancer including small cell carcinoma would be a leading consideration. No strong evidence of metastatic disease to the brain. 3. 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 This patient has previously had a contralateral distal left ICA aneurysm  clipped with no adverse features identified there. 4. Additionally there is a 60% atherosclerotic stenosis at the left ICA origin, but more advanced intracranial atherosclerosis including moderate to  severe stenoses of the right ICA supraclinoid segment and the right PCA. And up to moderate stenosis of the left ICA siphon and both distal vertebral arteries. Salient findings on this exam discussed by telephone with Dr. Joni Fears in the ED on 08/18/2018 at 15:56 . Electronically Signed   By: Genevie Ann M.D.   On: 08/28/2018 16:04   Dg Abd 1 View  Result Date: 09/05/18 CLINICAL DATA:  Nasogastric tube placement. EXAM: ABDOMEN - 1 VIEW COMPARISON:  Portable chest obtained at the same time. FINDINGS: Nasogastric tube tip in the mid stomach and side hole in the proximal stomach on this view. Gaseous distention of the stomach and small bowel with borderline dilatation of the proximal small bowel and normal caliber distal small bowel. Lumbar and lower thoracic spine degenerative changes. Diffuse osteopenia. IMPRESSION: 1. Nasogastric tube tip in the mid stomach and side hole in the proximal stomach. 2. Minimal proximal small bowel ileus or partial obstruction. Electronically Signed   By: Claudie Revering M.D.   On: 09-05-18 20:36   Ct Head Wo Contrast  Result Date: September 05, 2018 CLINICAL DATA:  Hemorrhage follow up EXAM: CT HEAD WITHOUT CONTRAST TECHNIQUE: Contiguous axial images were obtained from the base of the skull through the vertex without intravenous contrast. COMPARISON:  CTA head neck 07/31/2018 FINDINGS: Brain: Unchanged 7 mm left convexity subdural hematoma. No mass effect or midline shift. Size and configuration of the ventricles are unchanged. No new site of hemorrhage. White matter findings of chronic microvascular disease. No evidence of acute cortical infarct. Vascular: Previous clipping of left ICA aneurysm. Skull: Remote left pterional craniotomy. Sinuses/Orbits: No fluid levels or advanced mucosal  thickening of the visualized paranasal sinuses. No mastoid or middle ear effusion. The orbits are normal. IMPRESSION: Unchanged 7 mm left convexity subdural hematoma without mass effect or midline shift. Electronically Signed   By: Ulyses Jarred M.D.   On: 09-05-2018 18:16   Ct Head Wo Contrast  Addendum Date: 08/27/2018   ADDENDUM REPORT: 08/10/2018 16:01 ADDENDUM: After reviewing the subsequent CTA head neck with Dr. Nevada Crane, neuroradiologist, there is evidence of a small isodense LEFT convexity subdural hematoma on the order of 6 mm in thickness without evidence of midline shift. There is mild effacement of cortical sulci in the LEFT frontal and parietal lobes. IMPRESSION: Small subacute LEFT convexity subdural hematoma without midline shift. Electronically Signed   By: Evangeline Dakin M.D.   On: 08/13/2018 16:01   Result Date: 08/29/2018 CLINICAL DATA:  78 year old with aphasia, RIGHT-sided weakness and facial droop. Patient last seen normal at approximately 8:30 p.m. last night. Patient's family states that she fell from bed yesterday onto her wheelchair. Patient currently non-communicative. EXAM: CT HEAD WITHOUT CONTRAST TECHNIQUE: Contiguous axial images were obtained from the base of the skull through the vertex without intravenous contrast. COMPARISON:  None. FINDINGS: Brain: Severe head tilt in the gantry accounts for the apparent asymmetry in the cerebral hemispheres. Ventricular system normal in size and appearance for age. Mild age related cortical atrophy and mild deep atrophy. Moderate to severe cerebellar atrophy. Moderate changes of small vessel disease of the white matter diffusely. No mass lesion. No midline shift. No acute hemorrhage or hematoma. No extra-axial fluid collections. No evidence of acute infarction. Vascular: Apparent prior LEFT carotid trifurcation and/or MCA aneurysm with beam hardening streak artifact arising from the aneurysm clip. Moderate to severe BILATERAL carotid  siphon atherosclerosis and severe vertebrobasilar atherosclerosis. No hyperdense vessel. Skull: Prior LEFT frontotemporal craniotomy. No skull fracture or other  focal osseous abnormality involving the skull. Sinuses/Orbits: Visualized paranasal sinuses, bilateral mastoid air cells and bilateral middle ear cavities well-aerated. Visualized orbits and globes normal in appearance. Rightward gaze. Other: None. IMPRESSION: 1. No acute intracranial abnormality. 2. Mild age related cortical atrophy and moderate chronic microvascular ischemic changes of the white matter. Electronically Signed: By: Evangeline Dakin M.D. On: 08/21/2018 14:54   Ct Angio Neck W And/or Wo Contrast  Result Date: 08/16/2018 CLINICAL DATA:  78 year old female with right side weakness, facial droop and loss of speech. Last known well 2030 hours yesterday. EXAM: CT ANGIOGRAPHY HEAD AND NECK TECHNIQUE: Multidetector CT imaging of the head and neck was performed using the standard protocol during bolus administration of intravenous contrast. Multiplanar CT image reconstructions and MIPs were obtained to evaluate the vascular anatomy. Carotid stenosis measurements (when applicable) are obtained utilizing NASCET criteria, using the distal internal carotid diameter as the denominator. CONTRAST:  28mL OMNIPAQUE IOHEXOL 350 MG/ML SOLN COMPARISON:  Head CT without contrast 1439 hours. FINDINGS: CTA NECK Skeleton: Degenerative changes in the cervical spine. No destructive osseous lesion or acute osseous abnormality identified. Previous left frontotemporal craniotomy appears related to a left ICA terminus region aneurysm clipping. Upper chest: Abnormal. Partially visible bulky soft tissue tumor in the bilateral mediastinum. Anterior superior tumor on the left measures greater than 7 centimeters largest dimension. Tumor encases the right upper lobe pulmonary artery. Multifocal rounded and lobular bilateral pulmonary nodules suggest metastatic disease, and  there is a dominant 3.8 centimeter mass in the posterosuperior right upper lobe. Superimposed moderate to large layering left pleural effusion. Other neck: Asymmetric hyperenhancement of the right submandibular gland is of unclear etiology, but there is no suspicious neck mass or lymphadenopathy identified. Aortic arch: 3 vessel arch configuration. Mild arch atherosclerosis. Right carotid system: Tortuous brachiocephalic artery and proximal right CCA. Mild for age right CCA atherosclerosis. Right ICA origin and bulb soft and calcified plaque resulting in less than 50 % stenosis with respect to the distal vessel. Left carotid system: No left CCA origin stenosis. Mild left CCA atherosclerosis without stenosis. Soft and calcified plaque in the left ICA origin resulting in 60 % stenosis with respect to the distal vessel best seen on series 10, image 108. No additional stenosis in the neck distal to the bulb. Vertebral arteries: Tortuous proximal right subclavian artery without plaque or stenosis. Normal right vertebral artery origin. Patent right vertebral artery to the skull base without stenosis. Mild proximal left subclavian and left vertebral artery origin atherosclerosis. Mild left vertebral artery origin stenosis. Tortuous left V1 segment. Patent left vertebral artery to the skull base without additional stenosis. CTA HEAD Posterior circulation: Bilateral V4 segment calcified plaque. Mild to moderate distal vertebral artery stenosis bilaterally with patent vertebrobasilar junction. Proximal basilar bulky calcified plaque but only mild stenosis. Patent SCA origins. Normal right PCA origin. The left PCA origin is mildly obscured by streak artifact from the left ICA terminus vascular clip. Still, there is evidence of severe left PCA distal P1 and proximal P2 segment stenosis best seen on series 11, image 20. There is bilateral distal PCA enhancement. Posterior communicating arteries are diminutive or absent. Anterior  circulation:  Both ICA siphons are patent. On the right there is moderate calcified plaque with moderate to severe supraclinoid stenosis. 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 The right MCA and ACA origins remain normal. On the left there is moderate calcified  plaque resulting in mild to moderate left siphon stenosis. There is a distal left ICA aneurysm clip with no aneurysmal enhancement identified. The left ICA terminus remains patent. The left MCA and ACA origins appear within normal limits. Bilateral ACA branches are within normal limits. Anterior communicating artery is diminutive or absent. Right MCA M1 segment, bifurcation, and right MCA branches are within normal limits. Left MCA M1 segment and left MCA branches appear patent but attenuated with asymmetric appearing left dural venous enhancement and evidence of a small left subdural hematoma (4-5 millimeters in thickness). No left MCA branch occlusion is identified. Venous sinuses: Patent. Anatomic variants: None. Delayed phase: Asymmetric left convexity dural venous enhancement appears related to the small subdural hematoma. No abnormal parenchymal enhancement identified to suggest intra-axial metastatic disease. Stable gray-white matter differentiation from earlier today. Review of the MIP images confirms the above findings IMPRESSION: 1. Negative for large vessel occlusion. But positive a for 4-5 mm left convexity Subdural Hematoma, which might explain an attenuated appearance of the left MCA branches - and could be a stroke mimic for symptoms in that hemisphere. 2. Positive also for partially visible bulky tumor in the mediastinum and bilateral pulmonary metastatic disease with large layering left pleural effusion. Lung cancer including small cell carcinoma would be a leading consideration. No strong evidence of metastatic disease to the  brain. 3. 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 4. 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 And up to moderate stenosis of the left ICA siphon and both distal vertebral arteries. Salient findings on this exam discussed by telephone with Dr. Joni Fears in the ED on 08/18/2018 at 15:56 . Electronically Signed   By: Genevie Ann M.D.   On: 08/19/2018 16:04   US Carotid Bilateral (at [facility] And [facility] Only)  Result Date: 09/05/2018 CLINICAL DATA:  Acute CVA EXAM: BILATERAL CAROTID DUPLEX ULTRASOUND TECHNIQUE: Pearline Cables scale imaging, color Doppler and duplex ultrasound were performed of bilateral carotid and vertebral arteries in the neck. COMPARISON:  None. FINDINGS: Criteria: Quantification of carotid stenosis is based on velocity parameters that correlate the residual internal carotid diameter with NASCET-based stenosis levels, using the diameter of the distal internal carotid lumen as the denominator for stenosis measurement. The following velocity measurements were obtained: RIGHT ICA: 96 cm/sec CCA: 64 cm/sec SYSTOLIC ICA/CCA RATIO:  1.5 ECA: 50 cm/sec LEFT ICA: 91 cm/sec CCA: 67 cm/sec SYSTOLIC ICA/CCA RATIO:  1.4 ECA: 60 cm/sec RIGHT CAROTID ARTERY: Moderate irregular calcified plaque in the bulb. Low resistance internal carotid Doppler pattern. RIGHT VERTEBRAL ARTERY:  Antegrade. LEFT CAROTID ARTERY: Mild smooth calcified plaque in the bulb. Low resistance internal carotid Doppler pattern is preserved. LEFT VERTEBRAL ARTERY:  Antegrade. IMPRESSION: Less than 50% stenosis in the right and left internal carotid arteries. Electronically Signed   By: Marybelle Killings M.D.   On: 2018/09/05 09:32   Dg  Chest Port 1 View  Result Date: 2018-09-05 CLINICAL DATA:  Status post intubation. EXAM: PORTABLE CHEST 1 VIEW COMPARISON:  None. FINDINGS: Endotracheal tube tip 2.2 cm above the carina. Nasogastric tube extending into the stomach with the side hole in the region of the gastroesophageal junction and tip not included. Dense airspace opacity in the medial aspect  of the right upper lobe. Mild patchy airspace opacity in the left mid and lower lung zones. Minimal patchy airspace opacity in the right mid and lower lung zones. No visible pleural fluid. The left lateral costophrenic angle is not included. Lobulated soft tissue thickening in the mediastinum and hilar region on the left. Thoracic spine degenerative changes. IMPRESSION: 1. Endotracheal tube tip 2.2 cm above the carina. It is recommended that this be retracted 3 cm. 2. Nasogastric tube side hole in the region of the gastroesophageal junction. 3. Bilateral pneumonia, most pronounced in the right upper lobe with some volume loss. This could be an indication of postobstructive changes. 4. Probable mediastinal and left hilar adenopathy. Electronically Signed   By: Claudie Revering M.D.   On: 08/31/18 20:34    ASSESSMENT AND PLAN:   Active Problems:   Acute CVA (cerebrovascular accident) ([place])   Pressure injury of skin   Palliative care encounter   Neoplasm of lung   Protein-calorie malnutrition, severe   1.  Acute left side small SDH, measuring approximately 6 mm.   cause of strokelike symptoms or seizure.  No edema or shift noted.  Tele-neurosurgery and tele-neurology evaluated the patient and the recommendation is for starting IV fluids and antiseizure medication.  Continue to monitor clinically closely.  Allow permissive hypertension.  No surgical intervention needed at this time.  Patient was loaded with Keppra IV.  Will have PT/OT/ST evaluate and treat the patient.  Will check EEG. Will hold any anticoagulants and repeat CT head in 24 hours.   Inpatient neurology consult requested.  2.  Small 35mm aneurysm noted in anterior circulation on right.  Per neurosurgery, given this size and age of patient, no further treatment indicated at this time for this.  3.  Bulky tumor in the mediastinum and bilateral pulmonary metastatic disease is noted as incidental finding per CT Angio of the neck.  This is a new diagnosis.   consult oncology for further evaluation and treatment. Once pt improve mentally from [facility], may need further oncology work   4.  Malnutrition.  BMI 16.8.  Albumin is low at 2.5.  Patient has lost 20 to 40 pounds in the past 6 months due to poor appetite.  This likely related to metastatic malignancy.  5.  Normocytic anemia.  Hemoglobin level is currently at 8.6.  This is possibly related to malignancy.  Will check iron studies, B12 and folate levels.  6.  History of hypertension.  Will hold BP meds for now to allow permissive hypertension.  7.  Stage II sacral pressure ulcer, continue management per wound care team.  8.  Hyperlipidemia, on statin.   All the records are reviewed and case discussed with Care Management/Social Workerr. Management plans discussed with the patient, family and they are in agreement.  CODE STATUS: full.  TOTAL TIME TAKING CARE OF THIS PATIENT: 45 minutes.   Prognosis extremely poor, called palliative care/  POSSIBLE D/C IN 1-2 DAYS, DEPENDING ON CLINICAL CONDITION.   Vaughan Basta M.D on 31-Aug-2018   Between 7am to 6pm - Pager - [phone]  After 6pm go to [email or web address] - password [email or web address] [facility]  Office  [phone]  CC: Primary care physician; Marguerita Merles, MD  Note: This dictation was prepared with Dragon dictation along with smaller phrase technology. Any transcriptional errors that result from this process are unintentional.

## 2018-08-30 NOTE — Progress Notes (Signed)
SLP Cancellation Note  Patient Details Name: Michaela Dunn MRN: [id number] DOB: 1941/07/12   Cancelled treatment:          Consult received, chart reviewed. The patient is currently off the floor for testing.  Per neurology consult, patient remains non-responsive to verbal stimulation and sternal rub.  The patient's daughter reports patient to appear more lethargic than yesterday.  SLP will monitor for readiness for assessment.     Leroy Sea, [place], Susie 08/29/2018, 3:06 PM

## 2018-08-30 NOTE — Plan of Care (Signed)
  Problem: Education: Goal: Knowledge of disease or condition will improve Outcome: Not Progressing Goal: Knowledge of secondary prevention will improve Outcome: Not Progressing Goal: Knowledge of patient specific risk factors addressed and post discharge goals established will improve Outcome: Not Progressing Goal: Individualized Educational Video(s) Outcome: Not Progressing   Problem: Coping: Goal: Will verbalize positive feelings about self Outcome: Not Progressing Goal: Will identify appropriate support needs Outcome: Not Progressing   Problem: Health Behavior/Discharge Planning: Goal: Ability to manage health-related needs will improve Outcome: Not Progressing   Problem: Self-Care: Goal: Ability to participate in self-care as condition permits will improve Outcome: Not Progressing Goal: Verbalization of feelings and concerns over difficulty with self-care will improve Outcome: Not Progressing Goal: Ability to communicate needs accurately will improve Outcome: Not Progressing   Problem: Nutrition: Goal: Risk of aspiration will decrease Outcome: Not Progressing Goal: Dietary intake will improve Outcome: Not Progressing   Problem: Ischemic Stroke/TIA Tissue Perfusion: Goal: Complications of ischemic stroke/TIA will be minimized Outcome: Not Progressing   Problem: Education: Goal: Knowledge of General Education information will improve Description Including pain rating scale, medication(s)/side effects and non-pharmacologic comfort measures Outcome: Not Progressing   Problem: Health Behavior/Discharge Planning: Goal: Ability to manage health-related needs will improve Outcome: Not Progressing   Problem: Clinical Measurements: Goal: Ability to maintain clinical measurements within normal limits will improve Outcome: Not Progressing Goal: Will remain free from infection Outcome: Not Progressing Goal: Diagnostic test results will improve Outcome: Not  Progressing Goal: Respiratory complications will improve Outcome: Not Progressing Goal: Cardiovascular complication will be avoided Outcome: Not Progressing   Problem: Activity: Goal: Risk for activity intolerance will decrease Outcome: Not Progressing   Problem: Nutrition: Goal: Adequate nutrition will be maintained Outcome: Not Progressing   Problem: Coping: Goal: Level of anxiety will decrease Outcome: Not Progressing   Problem: Elimination: Goal: Will not experience complications related to bowel motility Outcome: Not Progressing Goal: Will not experience complications related to urinary retention Outcome: Not Progressing   Problem: Pain Managment: Goal: General experience of comfort will improve Outcome: Not Progressing   Problem: Safety: Goal: Ability to remain free from injury will improve Outcome: Not Progressing   Problem: Skin Integrity: Goal: Risk for impaired skin integrity will decrease Outcome: Not Progressing

## 2018-08-30 NOTE — ED Notes (Signed)
Tele Neuro performing consult at this time.

## 2018-08-30 NOTE — Progress Notes (Signed)
Patient arrived on unit at approximately 1950. Patient on infusion of dopamine at 10 and NS KVO. Patient lost pulse after being transferred to ICU bed, compressions started, epi given, and sodium bicarb given per Dr. Leslye Peer (present during code). Code lasted approximately 10 minutes before pulse was restored. Dr. Duwayne Heck spoke with daughter who transitioned patient to a DNR, after son arrived MD also updated him. Patient BP has started to decline despite increasing dopamine to 25. Dr. Duwayne Heck ordered for dopamine to be stopped, and that family does not want any further measures taken. Michaela Dunn

## 2018-08-30 NOTE — Consult Note (Addendum)
Referring Physician:  No referring provider defined for this encounter.  Primary Physician:  Michaela Merles, MD  Chief Complaint: Subdural hematoma  History of Present Illness: Michaela Dunn is a 78 y.o. female who presents as a consult for subdural hematoma.  Obtained from daughter who is in the room on evaluation.  According to daughter she spoke to her on the phone on Tuesday and noticed that her speech was slurred.  When she saw her mother she noted right arm weakness and that she was leaning to the right side in her wheelchair.  Also noticed that when she tried to feed her yogurt, the right side of her mouth was not quite right.   She was brought to the emergency room with altered mental status and weakness in her right arm admitted for further evaluation.  Initial CT revealed small, 6 mm thick subacute left subdural hematoma.  Daughter denies that patient was on any anticoagulants.  On evaluation patient was essentially unresponsive.  Daughter states that she has been like this all day which is a decline from where she was at yesterday when she was responding to voice.  I also spoke with the nurse and she states this is been the patient's current status all day.    Neurosurgery was contacted for consult.  Repeat head CT pending  Review of Systems:  A 10 point review of systems is negative, except for the pertinent positives and negatives detailed in the HPI.  Past Medical History: Past Medical History:  Diagnosis Date  . Depression   . Diabetes mellitus without complication ([place])   . Herpes zoster   . Hyperlipidemia   . Hypertension   . Knee pain, chronic     Past Surgical History: History reviewed. No pertinent surgical history.  Allergies: Allergies as of 08/17/2018 - Review Complete 08/13/2018  Allergen Reaction Noted  . Nsaids  05/12/2018    Medications:  Current Facility-Administered Medications:  .  0.9 %  sodium chloride infusion, , Intravenous, Continuous,  Michaela Jo, MD, Last Rate: 75 mL/hr at 31-Aug-2018 1122 .  acetaminophen (TYLENOL) tablet 650 mg, 650 mg, Oral, Q4H PRN **OR** acetaminophen (TYLENOL) solution 650 mg, 650 mg, Per Tube, Q4H PRN **OR** acetaminophen (TYLENOL) suppository 650 mg, 650 mg, Rectal, Q4H PRN, Michaela Jo, MD .  atorvastatin (LIPITOR) tablet 40 mg, 40 mg, Oral, Daily, Michaela Jo, MD .  donepezil (ARICEPT) tablet 10 mg, 10 mg, Oral, QHS, Michaela Jo, MD .  ferrous sulfate tablet 325 mg, 325 mg, Oral, Daily, Michaela Jo, MD .  levETIRAcetam (KEPPRA) 500 mg in sodium chloride 0.9 % 100 mL IVPB, 500 mg, Intravenous, Q12H, Michaela Jo, MD, Last Rate: 420 mL/hr at 08/31/2018 1121, 500 mg at 08/31/2018 1121 .  MEDLINE mouth rinse, 15 mL, Mouth Rinse, BID, Michaela Jo, MD, 15 mL at 08-31-2018 1122 .  mirtazapine (REMERON) tablet 7.5 mg, 7.5 mg, Oral, QHS, Michaela Jo, MD .  senna-docusate (Senokot-S) tablet 1 tablet, 1 tablet, Oral, QHS PRN, Michaela Jo, MD   Social History: Social History   Tobacco Use  . Smoking status: Never Smoker  . Smokeless tobacco: Never Used  Substance Use Topics  . Alcohol use: Not on file  . Drug use: Not on file    Family Medical History: Family History  Problem Relation Age of Onset  . Heart disease Father   . Stroke Father     Physical Examination: Vitals:   08-31-2018 1206 2018/08/31 1613  BP: 112/60 112/62  Pulse: 88  78  Resp: 18 18  Temp: 98.6 F (37 C) 98.7 F (37.1 C)  SpO2: 98% 98%     General: Unresponsive to voice or touch. Eyes:  Crusty purulent film noted around eyelashes.  Respiratory: Patient is breathing without any difficulty.  Extremities: Noted swelling right hand  Skin:   On exposed skin, there are no abnormal skin lesions.  NEUROLOGICAL:  Left pupil noted cataracts.  Right pupil slight dilation in response to light stimulus. Facial tone is symmetric.     Strength: Unable to assess due to patient's unresponsive state. Sensation: Unable to  assess due to patient's unresponsive state Reflexes are absent and symmetric at the biceps, triceps, brachioradialis, patella and achilles.   Clonus is not present  Imaging: CTA Head: 08/23/2018  IMPRESSION: 1. Negative for large vessel occlusion. But positive a for 4-5 mm left convexity Subdural Hematoma, which might explain an attenuated appearance of the left MCA branches - and could be a stroke mimic for symptoms in that hemisphere.  2. Positive also for partially visible bulky tumor in the mediastinum and bilateral pulmonary metastatic disease with large layering left pleural effusion. Lung cancer including small cell carcinoma would be a leading consideration. No strong evidence of metastatic disease to the brain.  3. 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  4. 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 And up to moderate stenosis of the left ICA siphon and both distal vertebral arteries.  CT head  08/18/2018 ADDENDUM: After reviewing the subsequent CTA head neck with Dr. [place] Dunn, neuroradiologist, there is evidence of a small isodense LEFT convexity subdural hematoma on the order of 6 mm in thickness without evidence of midline shift. There is mild effacement of cortical sulci in the LEFT frontal and parietal lobes.  IMPRESSION: Small subacute LEFT convexity subdural hematoma without midline Shift.  Assessment and Plan: Michaela Dunn is a pleasant 78 y.o. 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  Michaela Dunn has reviewed this case and imaging with recommendations for neurology evaluation.  No  surgical intervention recommended at this time.  Awaiting repeat head CT to determine if subdural hematoma has stabilized.  Michaela Dunn has requested that due to rapid mental decline, current order be changed to stat.  Spoken with the nurse in charge of patient and she accommodated.  Marin Olp, PA-C Dept. of Neurosurgery  Addendum to prior note.  The repeat HCT is stable.  This intracranial hemorrhage does not explain the patients decline in neurological status.  I would not recommend any neurosurgical intervention.  Stroke or other cause of her decline appears more likely.  Would consider palliative care consultation.  Meade Maw MD

## 2018-08-30 NOTE — ED Provider Notes (Signed)
[facility] Department of Emergency Medicine   Code Blue CONSULT NOTE  Chief Complaint: Cardiac arrest/unresponsive   Level V Caveat: Unresponsive  History of present illness: I was contacted by the hospital for a CODE BLUE cardiac arrest upstairs and presented to the patient's bedside.   Patiently reportedly had a seizure, received Versed, fentanyl recently, and then had a respiratory arrest.  There were 2 attempts at intubation made by respiratory therapy prior to calling a CODE BLUE, I came to the bedside with respiratory providing bag-valve-mask ventilation.  Patient's jaw was clenched at the time.  ROS: Unable to obtain, Level V caveat  Scheduled Meds: . atorvastatin  40 mg Oral Daily  . dextrose      . donepezil  10 mg Oral QHS  . fentaNYL      . fentaNYL (SUBLIMAZE) injection  100 mcg Intravenous Once  . ferrous sulfate  325 mg Oral Daily  . LORazepam      . mouth rinse  15 mL Mouth Rinse BID  . midazolam      . midazolam  2 mg Intravenous Once  . mirtazapine  7.5 mg Oral QHS  . naloxone       Continuous Infusions: . sodium chloride 75 mL/hr at 2018-09-05 1122  . levETIRAcetam 500 mg (05-Sep-2018 1121)   PRN Meds:.acetaminophen **OR** acetaminophen (TYLENOL) oral liquid 160 mg/5 mL **OR** acetaminophen, LORazepam, senna-docusate Past Medical History:  Diagnosis Date  . Depression   . Diabetes mellitus without complication ([place])   . Herpes zoster   . Hyperlipidemia   . Hypertension   . Knee pain, chronic    History reviewed. No pertinent surgical history. Social History   Socioeconomic History  . Marital status: Widowed    Spouse name: Not on file  . Number of children: Not on file  . Years of education: Not on file  . Highest education level: Not on file  Occupational History  . Not on file  Social Needs  . Financial resource strain: Not on file  . Food insecurity:    Worry: Not on file    Inability: Not on file  . Transportation  needs:    Medical: Not on file    Non-medical: Not on file  Tobacco Use  . Smoking status: Never Smoker  . Smokeless tobacco: Never Used  Substance and Sexual Activity  . Alcohol use: Not on file  . Drug use: Not on file  . Sexual activity: Not on file  Lifestyle  . Physical activity:    Days per week: Not on file    Minutes per session: Not on file  . Stress: Not on file  Relationships  . Social connections:    Talks on phone: Not on file    Gets together: Not on file    Attends religious service: Not on file    Active member of club or organization: Not on file    Attends meetings of clubs or organizations: Not on file    Relationship status: Not on file  . Intimate partner violence:    Fear of current or ex partner: Not on file    Emotionally abused: Not on file    Physically abused: Not on file    Forced sexual activity: Not on file  Other Topics Concern  . Not on file  Social History Narrative  . Not on file   Allergies  Allergen Reactions  . Nsaids     Last set of Vital Signs (not  current) Vitals:   08-30-2018 1613 08-30-2018 1901  BP: 112/62 (!) 171/80  Pulse: 78   Resp: 18   Temp: 98.7 F (37.1 C) 98 F (36.7 C)  SpO2: 98%       Physical Exam  Gen: unresponsive Cardiovascular: Positive radial pulse, and also slightly weaker rotted Resp: apneic. Breath sounds equal bilaterally with bagging  Abd: nondistended  Neuro: GCS 3, unresponsive to pain  HEENT: No blood in posterior pharynx, gag reflex absent  Neck: No crepitus  Musculoskeletal: No deformity  Skin: warm  Procedures  INTUBATION Performed by: Delman Kitten Required items: required blood products, implants, devices, and special equipment available Patient identity confirmed: provided demographic data and hospital-assigned identification number Time out: Immediately prior to procedure a "time out" was called to verify the correct patient, procedure, equipment, support staff and site/side marked  as required. Indications: Respiratory arrest Intubation method: Video laryngoscopy Preoxygenation: BVM Sedatives: Etomidate Paralytic: Succinylcholine (confirmed with nursing patient had previous normal potassium) Tube Size: 7.0 cuffed Post-procedure assessment: chest rise and ETCO2 monitor Breath sounds: equal and absent over the epigastrium Tube secured by Respiratory Therapy Patient tolerated the procedure well with no immediate complications. First attempt, no complications.  Intubation was without incident, well visualized with both myself and respiratory therapy confirming visualization of the endotracheal tube through the cords.  Secured, end-tidal CO2 monitor verified with good colorimetric change.  CRITICAL CARE Performed by: Delman Kitten Total critical care time: 15 Critical care time was exclusive of separately billable procedures and treating other patients. Critical care was necessary to treat or prevent imminent or life-threatening deterioration. Critical care was time spent personally by me on the following activities: development of treatment plan with patient and/or surrogate as well as nursing, discussions with consultants, evaluation of patient's response to treatment, examination of patient, obtaining history from patient or surrogate, ordering and performing treatments and interventions, ordering and review of laboratory studies, ordering and review of radiographic studies, pulse oximetry and re-evaluation of patient's condition.  Cardiopulmonary Resuscitation (CPR) Procedure Note  Directed/Performed by: Delman Kitten I personally directed ancillary staff and/or performed CPR in an effort to regain return of spontaneous circulation and to maintain cardiac, neuro and systemic perfusion.    Medical Decision making  Patient with respiratory arrest.  Patient required bag-valve-mask ventilation prior to intubation, due to a clenched jaw the patient had to receive RSI.  After  administration of RSI meds, patient got relaxed and was easily intubated.  Confirmed with traditional methodology including end-tidal CO2 and direct visualization, lung sounds slightly rhonchorous in the lower lobes bilaterally.  Assessment and Plan  Assisted in stabilization during respiratory then progressing to cardiac arrest.  Given naloxone as well as dextrose and epinephrine.  Ongoing care turned over to Dr. Bobbye Charleston who continued to run the cardiac arrest code at 7:41 PM    Delman Kitten, MD 2018-08-30 2013

## 2018-08-30 NOTE — Consult Note (Addendum)
Referring Physician: Doylene Canning    Chief Complaint: Right sided weakness, slurred speech and aphasia  HPI: Michaela Dunn is an 78 y.o. female with past medical history of depression, diabetes mellitus, herpes zoster, hyperlipidemia, hypertension, osteoarthritis of right shoulder, dementia, and anemia presenting to the ED on 08/19/2018 with right side weakness, facial droop and aphasia.  Per patient's daughter who provide history since patient is unable to provide own history, patient was in her usual state of health on 08/06/2018 when she last saw her. Patient's daughter states at baseline she is able to feed herself however has had issues with her knees therefore uses a wheelchair for mobility.  She had just been evaluated by orthopedics on 08/01/2018 for right shoulder pain,  x-ray was obtained and showed bursitis and therefore she was given steroid injections.  Per patient's daughter, patient was noted to have slurred speech and right-sided weakness on 08/07/2018. Patient's significant other reported that she had fell the day before but did not sustain obvious injury. She noticed that patient kept slanting to the right when propped up in chair and was unable to feed or express herself.  Per daughter this is was very unusual for her mother. She was concerned she may have had a stroke and therefore called EMS.  On arrival to the ED patient was unable to communicate so code stroke was initiated and patient evaluated by a tele-neurologist.  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  Initial NIH stroke scale 19.  Initial labs revealed decreased kidney function now improved with hydration, BUN 38 creatinine 1.26,  hemoglobin 9.6, LDL 78, vitamin B12 279, hematocrit 30.0, platelets 323, decreased albumin 2.7, lactic acid 1.9, troponins elevated 0.03, hemoglobin A1c 5.5 UA and urine culture positive for UTI. Given finding on CTA head and neck neurosurgery was consulted however patient was deemed not a candidate for surgical intervention given the size of aneurysm and age of patient.  Neurology therefore consulted for  possible left hemisphere stroke versus seizure and Todd's paralysis from the subdural hematoma.  Patient was loaded with Keppra 1000 mg IV x1 then started on maintenance dose with 500 mg twice daily.  She was admitted for further work-up and management.  Date last known well: Date: 08/07/2018 Time last known well: Time: 20:30 tPA Given: [id number] window period and SDH  Past Medical History:  Diagnosis Date  . Depression   . Diabetes mellitus without complication ([place])   . Herpes zoster   . Hyperlipidemia   . Hypertension   . Knee pain, chronic    History reviewed. No pertinent surgical history.  Family History  Problem Relation Age of Onset  . Heart disease Father   . Stroke Father    Social History:  reports that she has never smoked. She has never used smokeless tobacco. Her alcohol and drug histories are not on file.  Allergies:  Allergies  Allergen Reactions  . Nsaids     Medications:  I have reviewed the patient's current medications. Prior to Admission:  Medications Prior to Admission  Medication Sig Dispense Refill Last Dose  . atorvastatin (LIPITOR) 40 MG tablet Take 1 tablet (40 mg total) by mouth daily. 30 tablet 1 08/07/2018 at 2000  . donepezil (  ARICEPT) 10 MG tablet Take 10 mg by mouth at bedtime.  5 08/07/2018 at 2000  . FEROSUL 325 (65 Fe) MG tablet Take 325 mg by mouth daily.  3 08/14/2018 at 0800  . hydrALAZINE (APRESOLINE) 25 MG tablet Take 1 tablet (25 mg total) by mouth 2 (two) times daily. 60 tablet 1 08/15/2018 at 0800  . lisinopril-hydrochlorothiazide  (PRINZIDE,ZESTORETIC) 20-12.5 MG tablet Take 1 tablet by mouth daily.  5 08/20/2018 at 0800  . mirtazapine (REMERON) 7.5 MG tablet Take 7.5 mg by mouth at bedtime.  4 08/07/2018 at 2000  . NIFEdipine (ADALAT CC) 90 MG 24 hr tablet Take 1 tablet (90 mg total) by mouth daily. 30 tablet 1 08/29/2018 at 0800   Scheduled: . atorvastatin  40 mg Oral Daily  . donepezil  10 mg Oral QHS  . ferrous sulfate  325 mg Oral Daily  . mouth rinse  15 mL Mouth Rinse BID  . mirtazapine  7.5 mg Oral QHS    ROS: Unable to obtain from patient due to altered mental status  Physical Examination: Blood pressure 114/61, pulse 84, temperature 98.6 F (37 C), temperature source Axillary, resp. rate 20, height 5\' 3"  (1.6 m), weight 43.1 kg, SpO2 96 %.   HEENT-  Normocephalic, no lesions, without obvious abnormality.  Eye discharge.  Normal TM's bilaterally.  Normal auditory canals and external ears. Normal external nose, mucus membranes and septum.  Normal pharynx. Cardiovascular- S1, S2 normal, pulses palpable throughout   Lungs- chest clear, no wheezing, rales, normal symmetric air entry Abdomen- soft, non-tender; bowel sounds normal; no masses,  no organomegaly Extremities- Bilateral pitting edema of legs Lymph-no adenopathy palpable Musculoskeletal-no joint tenderness, deformity. Bilateral knee swelling Skin-warm and dry, no hyperpigmentation, vitiligo, or suspicious lesions  Mental Status: Patient does not respond to verbal stimuli.  Does not respond to deep sternal rub.  Does not follow commands.  No verbalizations noted.  Cranial Nerves: II: Patient does not respond to confrontation bilaterally, pupils right 2 mm, left 2 mm,and reactive bilaterally III,IV,VI: doll's response present bilaterally. V,VII: corneal reflex present bilaterally  VIII: patient does not respond to verbal stimuli IX,X: gag reflex not tested, XI: trapezius strength unable to test bilaterally XII: tongue strength unable to  test Motor: RUE flaccidity. Some LUE movement noted to command and spontaneously.  No spontaneous lower extremity movement noted.   Sensory: Respond to noxious stimuli in lower extremity. Deep Tendon Reflexes:  1+ in the RUE, 2+ in the LUE, absent in the lower extremities Plantars: Upgoing bilaterally Cerebellar: Unable to perform  Data Reviewed  Laboratory Studies:  Basic Metabolic Panel: Recent Labs  Lab 08/22/2018 1413  NA 136  K 3.7  CL 101  CO2 24  GLUCOSE 77  BUN 31*  CREATININE 0.84  CALCIUM 8.3*    Liver Function Tests: Recent Labs  Lab 08/24/2018 1413  AST 27  ALT 14  ALKPHOS 73  BILITOT 0.7  PROT 8.1  ALBUMIN 2.5*   No results for input(s): LIPASE, AMYLASE in the last 168 hours. No results for input(s): AMMONIA in the last 168 hours.  CBC: Recent Labs  Lab 08/04/2018 1413  WBC 7.2  NEUTROABS 5.1  HGB 8.6*  HCT 28.5*  MCV 86.1  PLT 388    Cardiac Enzymes: Recent Labs  Lab 08/15/2018 1413  TROPONINI <0.03    BNP: Invalid input(s): POCBNP  CBG: Recent Labs  Lab 08/14/2018 1410  GLUCAP 70    Microbiology: Results for orders placed or performed during  the hospital encounter of 07/31/2018  Urine culture     Status: None   Collection Time: 08/05/2018  2:13 PM  Result Value Ref Range Status   Specimen Description   Final    URINE, CATHETERIZED Performed at [facility], [address], [place], [place] [address]    Special Requests   Final    NONE Performed at [facility], [address] [place], [place] [address]    Culture   Final    NO GROWTH Performed at [facility], [place] [address] [place], [place] [address]    Report Status 08-12-18 FINAL  Final  Culture, blood (routine x 2)     Status: None (Preliminary result)   Collection Time: 08/05/2018  2:14 PM  Result Value Ref Range Status   Specimen Description BLOOD RIGHT ANTECUBITAL  Final   Special Requests   Final    BOTTLES DRAWN AEROBIC AND ANAEROBIC  Blood Culture adequate volume   Culture   Final    NO GROWTH < 24 HOURS Performed at [facility], [address] [place], [place] [address]    Report Status PENDING  Incomplete  Culture, blood (routine x 2)     Status: None (Preliminary result)   Collection Time: 08/04/2018  2:14 PM  Result Value Ref Range Status   Specimen Description BLOOD BLOOD LEFT FOREARM  Final   Special Requests   Final    BOTTLES DRAWN AEROBIC AND ANAEROBIC Blood Culture results may not be optimal due to an inadequate volume of blood received in culture bottles   Culture   Final    NO GROWTH < 24 HOURS Performed at [facility], [address] [place], [place] [address]    Report Status PENDING  Incomplete    Coagulation Studies: No results for input(s): LABPROT, INR in the last 72 hours.  Urinalysis:  Recent Labs  Lab 08/11/2018 1413  COLORURINE YELLOW*  LABSPEC 1.012  PHURINE 6.0  GLUCOSEU NEGATIVE  HGBUR NEGATIVE  BILIRUBINUR NEGATIVE  KETONESUR 5*  PROTEINUR 30*  NITRITE NEGATIVE  LEUKOCYTESUR NEGATIVE    Lipid Panel:    Component Value Date/Time   CHOL 133 12-Aug-2018 0637   TRIG 65 Aug 12, 2018 0637   HDL 42 Aug 12, 2018 0637   CHOLHDL 3.2 Aug 12, 2018 0637   VLDL 13 08/12/2018 0637   LDLCALC 78 08/12/18 0637    HgbA1C:  Lab Results  Component Value Date   HGBA1C 5.5 August 12, 2018    Urine Drug Screen:  No results found for: LABOPIA, COCAINSCRNUR, LABBENZ, AMPHETMU, THCU, LABBARB  Alcohol Level: No results for input(s): ETH in the last 168 hours.  Other results: EKG: normal EKG, normal sinus rhythm, unchanged from previous tracings. Vent. rate 96 BPM PR interval * ms QRS duration 92 ms QT/QTc 351/444 ms P-R-T axes 56 -33 113  Imaging: Ct Angio Head W Or Wo Contrast  Result Date: 08/11/2018 CLINICAL DATA:  78 year old female with right side weakness, facial droop and loss of speech. Last known well 2030 hours yesterday. EXAM: CT ANGIOGRAPHY HEAD AND NECK  TECHNIQUE: Multidetector CT imaging of the head and neck was performed using the standard protocol during bolus administration of intravenous contrast. Multiplanar CT image reconstructions and MIPs were obtained to evaluate the vascular anatomy. Carotid stenosis measurements (when applicable) are obtained utilizing NASCET criteria, using the distal internal carotid diameter as the denominator. CONTRAST:  54mL OMNIPAQUE IOHEXOL 350 MG/ML SOLN COMPARISON:  Head CT without contrast 1439 hours. FINDINGS: CTA NECK  Skeleton: Degenerative changes in the cervical spine. No destructive osseous lesion or acute osseous abnormality identified. Previous left frontotemporal craniotomy appears related to a left ICA terminus region aneurysm clipping. Upper chest: Abnormal. Partially visible bulky soft tissue tumor in the bilateral mediastinum. Anterior superior tumor on the left measures greater than 7 centimeters largest dimension. Tumor encases the right upper lobe pulmonary artery. Multifocal rounded and lobular bilateral pulmonary nodules suggest metastatic disease, and there is a dominant 3.8 centimeter mass in the posterosuperior right upper lobe. Superimposed moderate to large layering left pleural effusion. Other neck: Asymmetric hyperenhancement of the right submandibular gland is of unclear etiology, but there is no suspicious neck mass or lymphadenopathy identified. Aortic arch: 3 vessel arch configuration. Mild arch atherosclerosis. Right carotid system: Tortuous brachiocephalic artery and proximal right CCA. Mild for age right CCA atherosclerosis. Right ICA origin and bulb soft and calcified plaque resulting in less than 50 % stenosis with respect to the distal vessel. Left carotid system: No left CCA origin stenosis. Mild left CCA atherosclerosis without stenosis. Soft and calcified plaque in the left ICA origin resulting in 60 % stenosis with respect to the distal vessel best seen on series 10, image 108. No  additional stenosis in the neck distal to the bulb. Vertebral arteries: Tortuous proximal right subclavian artery without plaque or stenosis. Normal right vertebral artery origin. Patent right vertebral artery to the skull base without stenosis. Mild proximal left subclavian and left vertebral artery origin atherosclerosis. Mild left vertebral artery origin stenosis. Tortuous left V1 segment. Patent left vertebral artery to the skull base without additional stenosis. CTA HEAD Posterior circulation: Bilateral V4 segment calcified plaque. Mild to moderate distal vertebral artery stenosis bilaterally with patent vertebrobasilar junction. Proximal basilar bulky calcified plaque but only mild stenosis. Patent SCA origins. Normal right PCA origin. The left PCA origin is mildly obscured by streak artifact from the left ICA terminus vascular clip. Still, there is evidence of severe left PCA distal P1 and proximal P2 segment stenosis best seen on series 11, image 20. There is bilateral distal PCA enhancement. Posterior communicating arteries are diminutive or absent. Anterior circulation:  Both ICA siphons are patent. On the right there is moderate calcified plaque with moderate to severe supraclinoid stenosis. 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 The right MCA and ACA origins remain normal. On the left there is moderate calcified plaque resulting in mild to moderate left siphon stenosis. There is a distal left ICA aneurysm clip with no aneurysmal enhancement identified. The left ICA terminus remains patent. The left MCA and ACA origins appear within normal limits. Bilateral ACA branches are within normal limits. Anterior communicating artery is diminutive or absent. Right MCA M1 segment, bifurcation, and right MCA branches are within normal limits. Left MCA M1 segment and left MCA branches appear patent but  attenuated with asymmetric appearing left dural venous enhancement and evidence of a small left subdural hematoma (4-5 millimeters in thickness). No left MCA branch occlusion is identified. Venous sinuses: Patent. Anatomic variants: None. Delayed phase: Asymmetric left convexity dural venous enhancement appears related to the small subdural hematoma. No abnormal parenchymal enhancement identified to suggest intra-axial metastatic disease. Stable gray-white matter differentiation from earlier today. Review of the MIP images confirms the above findings IMPRESSION: 1. Negative for large vessel occlusion. But positive a for 4-5 mm left convexity Subdural Hematoma, which might explain an attenuated appearance of the  left MCA branches - and could be a stroke mimic for symptoms in that hemisphere. 2. Positive also for partially visible bulky tumor in the mediastinum and bilateral pulmonary metastatic disease with large layering left pleural effusion. Lung cancer including small cell carcinoma would be a leading consideration. No strong evidence of metastatic disease to the brain. 3. 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 4. 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 And up to moderate stenosis of the left ICA siphon and both distal vertebral arteries. Salient findings on this exam discussed by telephone with Dr. Joni Fears in the ED on 08/03/2018 at 15:56 . Electronically Signed   By: Genevie Ann M.D.   On: 08/18/2018 16:04   Ct Head Wo Contrast  Addendum Date: 08/24/2018   ADDENDUM REPORT: 07/30/2018 16:01 ADDENDUM: After reviewing the subsequent CTA head neck with Dr. Nevada Crane, neuroradiologist, there  is evidence of a small isodense LEFT convexity subdural hematoma on the order of 6 mm in thickness without evidence of midline shift. There is mild effacement of cortical sulci in the LEFT frontal and parietal lobes. IMPRESSION: Small subacute LEFT convexity subdural hematoma without midline shift. Electronically Signed   By: Evangeline Dakin M.D.   On: 08/07/2018 16:01   Result Date: 08/06/2018 CLINICAL DATA:  78 year old with aphasia, RIGHT-sided weakness and facial droop. Patient last seen normal at approximately 8:30 p.m. last night. Patient's family states that she fell from bed yesterday onto her wheelchair. Patient currently non-communicative. EXAM: CT HEAD WITHOUT CONTRAST TECHNIQUE: Contiguous axial images were obtained from the base of the skull through the vertex without intravenous contrast. COMPARISON:  None. FINDINGS: Brain: Severe head tilt in the gantry accounts for the apparent asymmetry in the cerebral hemispheres. Ventricular system normal in size and appearance for age. Mild age related cortical atrophy and mild deep atrophy. Moderate to severe cerebellar atrophy. Moderate changes of small vessel disease of the white matter diffusely. No mass lesion. No midline shift. No acute hemorrhage or hematoma. No extra-axial fluid collections. No evidence of acute infarction. Vascular: Apparent prior LEFT carotid trifurcation and/or MCA aneurysm with beam hardening streak artifact arising from the aneurysm clip. Moderate to severe BILATERAL carotid siphon atherosclerosis and severe vertebrobasilar atherosclerosis. No hyperdense vessel. Skull: Prior LEFT frontotemporal craniotomy. No skull fracture or other focal osseous abnormality involving the skull. Sinuses/Orbits: Visualized paranasal sinuses, bilateral mastoid air cells and bilateral middle ear cavities well-aerated. Visualized orbits and globes normal in appearance. Rightward gaze. Other: None. IMPRESSION: 1. No acute intracranial abnormality. 2.  Mild age related cortical atrophy and moderate chronic microvascular ischemic changes of the white matter. Electronically Signed: By: Evangeline Dakin M.D. On: 07/30/2018 14:54   Ct Angio Neck W And/or Wo Contrast  Result Date: 08/13/2018 CLINICAL DATA:  78 year old female with right side weakness, facial droop and loss of speech. Last known well 2030 hours yesterday. EXAM: CT ANGIOGRAPHY HEAD AND NECK TECHNIQUE: Multidetector CT imaging of the head and neck was performed using the standard protocol during bolus administration of intravenous contrast. Multiplanar CT image reconstructions and MIPs were obtained to evaluate the vascular anatomy. Carotid stenosis measurements (when applicable) are obtained utilizing NASCET criteria, using the distal internal carotid diameter as the denominator. CONTRAST:  3mL OMNIPAQUE IOHEXOL 350 MG/ML SOLN COMPARISON:  Head CT without contrast 1439 hours. FINDINGS: CTA NECK Skeleton: Degenerative changes in the cervical spine. No destructive osseous lesion or acute osseous abnormality identified. Previous left frontotemporal craniotomy appears related to a left ICA terminus region aneurysm clipping. Upper chest: Abnormal. Partially visible bulky soft tissue tumor in the bilateral mediastinum. Anterior superior tumor on the left measures greater than 7 centimeters largest dimension. Tumor encases the right upper lobe pulmonary artery. Multifocal rounded and lobular bilateral pulmonary nodules suggest metastatic disease, and there is a dominant 3.8 centimeter mass in the posterosuperior right upper lobe. Superimposed moderate to large layering left pleural effusion. Other neck: Asymmetric hyperenhancement of the right submandibular gland is of unclear etiology, but there is no suspicious neck mass or lymphadenopathy identified. Aortic arch: 3 vessel arch configuration. Mild arch atherosclerosis. Right carotid system: Tortuous brachiocephalic artery and proximal right CCA. Mild  for age right CCA atherosclerosis. Right ICA origin and bulb soft and calcified plaque resulting in less than 50 % stenosis with respect to the distal vessel. Left carotid system: No left CCA origin stenosis. Mild left CCA atherosclerosis without stenosis. Soft and calcified plaque in the left ICA origin resulting in 60 % stenosis with respect to the distal vessel best seen on series 10, image 108. No additional stenosis in the neck distal to the bulb. Vertebral arteries: Tortuous proximal right subclavian artery without plaque or stenosis. Normal right vertebral artery origin. Patent right vertebral artery to the skull base without stenosis. Mild proximal left subclavian and left vertebral artery origin atherosclerosis. Mild left vertebral artery origin stenosis. Tortuous left V1 segment. Patent left vertebral artery to the skull base without additional stenosis. CTA HEAD Posterior circulation: Bilateral V4 segment calcified plaque. Mild to moderate distal vertebral artery stenosis bilaterally with patent vertebrobasilar junction. Proximal basilar bulky calcified plaque but only mild stenosis. Patent SCA origins. Normal right PCA origin. The left PCA origin is mildly obscured by streak artifact from the left ICA terminus vascular clip. Still, there is evidence of severe left PCA distal P1 and proximal P2 segment stenosis best seen on series 11, image 20. There is bilateral distal PCA enhancement. Posterior communicating arteries are diminutive or absent. Anterior circulation:  Both ICA siphons are patent. On the right there is moderate calcified plaque with moderate to severe supraclinoid stenosis. 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 The right MCA and ACA origins remain normal. On the left there is moderate calcified plaque resulting in mild to moderate left siphon stenosis. There is a distal left ICA  aneurysm clip with no aneurysmal enhancement identified. The left ICA terminus remains patent. The left MCA and ACA origins appear within normal limits. Bilateral ACA branches are within normal limits. Anterior communicating artery is diminutive or absent. Right MCA M1 segment, bifurcation, and right MCA branches are within normal limits. Left MCA M1 segment and left MCA branches appear patent but attenuated with asymmetric appearing left dural venous enhancement and evidence of a small left subdural hematoma (4-5 millimeters in thickness). No left MCA branch occlusion is identified. Venous sinuses: Patent. Anatomic variants: None. Delayed phase: Asymmetric left convexity dural venous enhancement appears related to the small subdural hematoma. No abnormal parenchymal enhancement identified to suggest intra-axial metastatic disease. Stable gray-white matter differentiation from earlier today. Review of the MIP images confirms the above findings IMPRESSION: 1. Negative for large vessel occlusion. But  positive a for 4-5 mm left convexity Subdural Hematoma, which might explain an attenuated appearance of the left MCA branches - and could be a stroke mimic for symptoms in that hemisphere. 2. Positive also for partially visible bulky tumor in the mediastinum and bilateral pulmonary metastatic disease with large layering left pleural effusion. Lung cancer including small cell carcinoma would be a leading consideration. No strong evidence of metastatic disease to the brain. 3. 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 4. 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 And up to moderate stenosis of the left  ICA siphon and both distal vertebral arteries. Salient findings on this exam discussed by telephone with Dr. Joni Fears in the ED on 08/07/2018 at 15:56 . Electronically Signed   By: Genevie Ann M.D.   On: 08/01/2018 16:04   US Carotid Bilateral (at [facility] And [facility] Only)  Result Date: 2018-09-03 CLINICAL DATA:  Acute CVA EXAM: BILATERAL CAROTID DUPLEX ULTRASOUND TECHNIQUE: Pearline Cables scale imaging, color Doppler and duplex ultrasound were performed of bilateral carotid and vertebral arteries in the neck. COMPARISON:  None. FINDINGS: Criteria: Quantification of carotid stenosis is based on velocity parameters that correlate the residual internal carotid diameter with NASCET-based stenosis levels, using the diameter of the distal internal carotid lumen as the denominator for stenosis measurement. The following velocity measurements were obtained: RIGHT ICA: 96 cm/sec CCA: 64 cm/sec SYSTOLIC ICA/CCA RATIO:  1.5 ECA: 50 cm/sec LEFT ICA: 91 cm/sec CCA: 67 cm/sec SYSTOLIC ICA/CCA RATIO:  1.4 ECA: 60 cm/sec RIGHT CAROTID ARTERY: Moderate irregular calcified plaque in the bulb. Low resistance internal carotid Doppler pattern. RIGHT VERTEBRAL ARTERY:  Antegrade. LEFT CAROTID ARTERY: Mild smooth calcified plaque in the bulb. Low resistance internal carotid Doppler pattern is preserved. LEFT VERTEBRAL ARTERY:  Antegrade. IMPRESSION: Less than 50% stenosis in the right and left internal carotid arteries. Electronically Signed   By: Marybelle Killings M.D.   On: September 03, 2018 09:32   Assessment: 78 y.o. female with past medical history of depression, diabetes mellitus, herpes zoster, hyperlipidemia, hypertension, osteoarthritis of right shoulder, dementia, and anemia presenting to the ED on 08/23/2018 with right side weakness, facial droop and aphasia.  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 Concerns remains for possible left hemisphere stroke versus seizure and Todd's paralysis from the subdural hematoma. 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  Neurosurgery was consulted however patient was deemed not a candidate for surgical intervention given the size of aneurysm and age of patient.  Subdural does not require intervention at this time.   Patient loaded with Keppra.  Although this can cause lethargy, patient was lethargic when family found patient.  If lethargy continues and no other etiology noted will consider change in anticonvulsant therapy.  Further work up indicated.    Stroke Risk Factors - diabetes mellitus, family history, hyperlipidemia and hypertension  Plan: 1. Hold antiplatelets or anticoagulation due to  SDH 2. Repeat CT Head without contrast to ensure SDH is stable and re-eval for possible stroke 3. PT consult, OT consult, Speech consult 4. Echocardiogram 5. Continue Keppra 500 mg IV BID 6. EEG pending 7. Seizure precautions 8. Ativan prn seizure activity 9. NPO until RN stroke swallow screen 10. Telemetry monitoring 11. Frequent neuro checks  This patient was staffed with Dr. Magda Paganini, Doy Mince who personally evaluated patient, reviewed documentation and agreed with assessment and plan of care as above.  Rufina Falco, DNP, FNP-BC Board certified Nurse Practitioner Neurology Department  08-26-2018, 1:51 PM

## 2018-08-30 NOTE — Progress Notes (Signed)
Patient expired at 2146, Dr. Patsey Berthold notified. Patient extubated per Dr. Patsey Berthold. [place] Donors notified, referral number [phone] (spoke with Guerry Bruin). AC notified. Family still currently present in the room. Wilnette Kales

## 2018-08-30 NOTE — Consult Note (Signed)
[facility]  Telephone:([phone] [phone]   Name: Michaela Dunn Date: 08-15-18 MRN: [id number]  DOB: 1940/09/09  Patient Care Team: Marguerita Merles, MD as PCP - General (Family Medicine)    REASON FOR CONSULTATION: Palliative Care consult requested for this 78 y.o. female with multiple medical problems including depression, dementia, h/o herpes zoster, DM, HTN, HLD, OA, and anemia.  Patient was admitted to the hospital on 07/30/2018 for right-sided weakness, slurred speech, and aphasia.  Work-up which included head CT and CTA of the head and neck revealed a left subdural hematoma, 7 x 7 mm right ICA aneurysm, and bilateral pulmonary nodules and mediastinal lesions consistent with probable metastatic disease.  Palliative care was consulted to help address goals.  SOCIAL HISTORY:    Patient is widowed.  She lives at home with her significant other.  She has a son and daughter who are both involved in her care.  ADVANCE DIRECTIVES:  Does not have  CODE STATUS: Full code  PAST MEDICAL HISTORY: Past Medical History:  Diagnosis Date  . Depression   . Diabetes mellitus without complication ([place])   . Herpes zoster   . Hyperlipidemia   . Hypertension   . Knee pain, chronic     PAST SURGICAL HISTORY: History reviewed. No pertinent surgical history.  HEMATOLOGY/ONCOLOGY HISTORY:   No history exists.    ALLERGIES:  is allergic to nsaids.  MEDICATIONS:  Current Facility-Administered Medications  Medication Dose Route Frequency Provider Last Rate Last Dose  . 0.9 %  sodium chloride infusion   Intravenous Continuous Amelia Jo, MD 75 mL/hr at 2018-08-15 1122    . acetaminophen (TYLENOL) tablet 650 mg  650 mg Oral Q4H PRN Amelia Jo, MD       Or  . acetaminophen (TYLENOL) solution 650 mg  650 mg Per Tube Q4H PRN Amelia Jo, MD       Or  . acetaminophen (TYLENOL) suppository 650 mg  650 mg Rectal Q4H PRN Amelia Jo, MD      . atorvastatin (LIPITOR) tablet 40 mg  40 mg Oral Daily Amelia Jo, MD      . donepezil (ARICEPT) tablet 10 mg  10 mg Oral QHS Amelia Jo, MD      . ferrous sulfate tablet 325 mg  325 mg Oral Daily Amelia Jo, MD      . levETIRAcetam (KEPPRA) 500 mg in sodium chloride 0.9 % 100 mL IVPB  500 mg Intravenous Q12H Amelia Jo, MD 420 mL/hr at Aug 15, 2018 1121 500 mg at 2018/08/15 1121  . MEDLINE mouth rinse  15 mL Mouth Rinse BID Amelia Jo, MD   15 mL at 08-15-18 1122  . mirtazapine (REMERON) tablet 7.5 mg  7.5 mg Oral QHS Amelia Jo, MD      . senna-docusate (Senokot-S) tablet 1 tablet  1 tablet Oral QHS PRN Amelia Jo, MD        VITAL SIGNS: BP 114/61 (BP Location: Right Arm)   Pulse 84   Temp 98.6 F (37 C) (Axillary)   Resp 20   Ht 5' 3" (1.6 m)   Wt 95 lb (43.1 kg)   SpO2 96%   BMI 16.83 kg/m  Filed Weights   08/22/2018 1355  Weight: 95 lb (43.1 kg)    Estimated body mass index is 16.83 kg/m as calculated from the following:   Height as of this encounter: 5' 3" (1.6 m).   Weight as of this encounter: 95  lb (43.1 kg).  LABS: CBC:    Component Value Date/Time   WBC 7.2 08/06/2018 1413   HGB 8.6 (L) 08/27/2018 1413   HCT 28.5 (L) 08/20/2018 1413   PLT 388 08/24/2018 1413   MCV 86.1 08/23/2018 1413   NEUTROABS 5.1 08/28/2018 1413   LYMPHSABS 1.2 08/05/2018 1413   MONOABS 0.8 08/28/2018 1413   EOSABS 0.0 08/17/2018 1413   BASOSABS 0.0 08/18/2018 1413   Comprehensive Metabolic Panel:    Component Value Date/Time   NA 136 08/15/2018 1413   K 3.7 08/11/2018 1413   CL 101 08/27/2018 1413   CO2 24 08/07/2018 1413   BUN 31 (H) 08/07/2018 1413   CREATININE 0.84 08/21/2018 1413   GLUCOSE 77 08/01/2018 1413   CALCIUM 8.3 (L) 08/15/2018 1413   AST 27 08/04/2018 1413   ALT 14 08/27/2018 1413   ALKPHOS 73 08/14/2018 1413   BILITOT 0.7 08/22/2018 1413   PROT 8.1 07/31/2018 1413   ALBUMIN 2.5 (L) 08/26/2018 1413    RADIOGRAPHIC STUDIES: Ct  Angio Head W Or Wo Contrast  Result Date: 08/03/2018 CLINICAL DATA:  78 year old female with right side weakness, facial droop and loss of speech. Last known well 2030 hours yesterday. EXAM: CT ANGIOGRAPHY HEAD AND NECK TECHNIQUE: Multidetector CT imaging of the head and neck was performed using the standard protocol during bolus administration of intravenous contrast. Multiplanar CT image reconstructions and MIPs were obtained to evaluate the vascular anatomy. Carotid stenosis measurements (when applicable) are obtained utilizing NASCET criteria, using the distal internal carotid diameter as the denominator. CONTRAST:  73m OMNIPAQUE IOHEXOL 350 MG/ML SOLN COMPARISON:  Head CT without contrast 1439 hours. FINDINGS: CTA NECK Skeleton: Degenerative changes in the cervical spine. No destructive osseous lesion or acute osseous abnormality identified. Previous left frontotemporal craniotomy appears related to a left ICA terminus region aneurysm clipping. Upper chest: Abnormal. Partially visible bulky soft tissue tumor in the bilateral mediastinum. Anterior superior tumor on the left measures greater than 7 centimeters largest dimension. Tumor encases the right upper lobe pulmonary artery. Multifocal rounded and lobular bilateral pulmonary nodules suggest metastatic disease, and there is a dominant 3.8 centimeter mass in the posterosuperior right upper lobe. Superimposed moderate to large layering left pleural effusion. Other neck: Asymmetric hyperenhancement of the right submandibular gland is of unclear etiology, but there is no suspicious neck mass or lymphadenopathy identified. Aortic arch: 3 vessel arch configuration. Mild arch atherosclerosis. Right carotid system: Tortuous brachiocephalic artery and proximal right CCA. Mild for age right CCA atherosclerosis. Right ICA origin and bulb soft and calcified plaque resulting in less than 50 % stenosis with respect to the distal vessel. Left carotid system: No left  CCA origin stenosis. Mild left CCA atherosclerosis without stenosis. Soft and calcified plaque in the left ICA origin resulting in 60 % stenosis with respect to the distal vessel best seen on series 10, image 108. No additional stenosis in the neck distal to the bulb. Vertebral arteries: Tortuous proximal right subclavian artery without plaque or stenosis. Normal right vertebral artery origin. Patent right vertebral artery to the skull base without stenosis. Mild proximal left subclavian and left vertebral artery origin atherosclerosis. Mild left vertebral artery origin stenosis. Tortuous left V1 segment. Patent left vertebral artery to the skull base without additional stenosis. CTA HEAD Posterior circulation: Bilateral V4 segment calcified plaque. Mild to moderate distal vertebral artery stenosis bilaterally with patent vertebrobasilar junction. Proximal basilar bulky calcified plaque but only mild stenosis. Patent SCA origins. Normal right PCA origin.  The left PCA origin is mildly obscured by streak artifact from the left ICA terminus vascular clip. Still, there is evidence of severe left PCA distal P1 and proximal P2 segment stenosis best seen on series 11, image 20. There is bilateral distal PCA enhancement. Posterior communicating arteries are diminutive or absent. Anterior circulation:  Both ICA siphons are patent. On the right there is moderate calcified plaque with moderate to severe supraclinoid stenosis. 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 The right MCA and ACA origins remain normal. On the left there is moderate calcified plaque resulting in mild to moderate left siphon stenosis. There is a distal left ICA aneurysm clip with no aneurysmal enhancement identified. The left ICA terminus remains patent. The left MCA and ACA origins appear within normal limits. Bilateral ACA branches are within  normal limits. Anterior communicating artery is diminutive or absent. Right MCA M1 segment, bifurcation, and right MCA branches are within normal limits. Left MCA M1 segment and left MCA branches appear patent but attenuated with asymmetric appearing left dural venous enhancement and evidence of a small left subdural hematoma (4-5 millimeters in thickness). No left MCA branch occlusion is identified. Venous sinuses: Patent. Anatomic variants: None. Delayed phase: Asymmetric left convexity dural venous enhancement appears related to the small subdural hematoma. No abnormal parenchymal enhancement identified to suggest intra-axial metastatic disease. Stable gray-white matter differentiation from earlier today. Review of the MIP images confirms the above findings IMPRESSION: 1. Negative for large vessel occlusion. But positive a for 4-5 mm left convexity Subdural Hematoma, which might explain an attenuated appearance of the left MCA branches - and could be a stroke mimic for symptoms in that hemisphere. 2. Positive also for partially visible bulky tumor in the mediastinum and bilateral pulmonary metastatic disease with large layering left pleural effusion. Lung cancer including small cell carcinoma would be a leading consideration. No strong evidence of metastatic disease to the brain. 3. 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 4. 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 And up to moderate stenosis of the left ICA siphon and both distal vertebral arteries. Salient findings on this exam discussed by telephone with Dr. Joni Fears in the ED on 08/28/2018 at 15:56 . Electronically Signed   By: Genevie Ann M.D.   On: 08/26/2018 16:04   Ct Head Wo Contrast  Addendum Date: 08/03/2018   ADDENDUM REPORT: 08/29/2018 16:01 ADDENDUM: After reviewing the subsequent CTA head neck with Dr. Nevada Crane, neuroradiologist, there is evidence of a small isodense LEFT convexity subdural hematoma on the order of 6 mm in thickness without evidence of midline shift. There is mild effacement of cortical sulci in the LEFT frontal and parietal lobes. IMPRESSION: Small subacute LEFT convexity subdural hematoma without midline shift. Electronically Signed   By: Evangeline Dakin M.D.   On: 08/24/2018 16:01   Result Date: 08/21/2018 CLINICAL DATA:  78 year old with aphasia, RIGHT-sided weakness and facial droop. Patient last seen normal at approximately 8:30 p.m. last night. Patient's family states that she fell from bed yesterday onto her wheelchair. Patient currently non-communicative. EXAM: CT HEAD WITHOUT CONTRAST TECHNIQUE: Contiguous axial images were  obtained from the base of the skull through the vertex without intravenous contrast. COMPARISON:  None. FINDINGS: Brain: Severe head tilt in the gantry accounts for the apparent asymmetry in the cerebral hemispheres. Ventricular system normal in size and appearance for age. Mild age related cortical atrophy and mild deep atrophy. Moderate to severe cerebellar atrophy. Moderate changes of small vessel disease of the white matter diffusely. No mass lesion. No midline shift. No acute hemorrhage or hematoma. No extra-axial fluid collections. No evidence of acute infarction. Vascular: Apparent prior LEFT carotid trifurcation and/or MCA aneurysm with beam hardening streak artifact arising from the aneurysm clip. Moderate to severe BILATERAL carotid siphon atherosclerosis and severe vertebrobasilar atherosclerosis. No hyperdense vessel. Skull: Prior LEFT frontotemporal craniotomy. No skull fracture or other focal osseous abnormality involving the skull. Sinuses/Orbits: Visualized paranasal  sinuses, bilateral mastoid air cells and bilateral middle ear cavities well-aerated. Visualized orbits and globes normal in appearance. Rightward gaze. Other: None. IMPRESSION: 1. No acute intracranial abnormality. 2. Mild age related cortical atrophy and moderate chronic microvascular ischemic changes of the white matter. Electronically Signed: By: Evangeline Dakin M.D. On: 08/17/2018 14:54   Ct Angio Neck W And/or Wo Contrast  Result Date: 08/17/2018 CLINICAL DATA:  78 year old female with right side weakness, facial droop and loss of speech. Last known well 2030 hours yesterday. EXAM: CT ANGIOGRAPHY HEAD AND NECK TECHNIQUE: Multidetector CT imaging of the head and neck was performed using the standard protocol during bolus administration of intravenous contrast. Multiplanar CT image reconstructions and MIPs were obtained to evaluate the vascular anatomy. Carotid stenosis measurements (when applicable) are obtained utilizing NASCET criteria, using the distal internal carotid diameter as the denominator. CONTRAST:  56m OMNIPAQUE IOHEXOL 350 MG/ML SOLN COMPARISON:  Head CT without contrast 1439 hours. FINDINGS: CTA NECK Skeleton: Degenerative changes in the cervical spine. No destructive osseous lesion or acute osseous abnormality identified. Previous left frontotemporal craniotomy appears related to a left ICA terminus region aneurysm clipping. Upper chest: Abnormal. Partially visible bulky soft tissue tumor in the bilateral mediastinum. Anterior superior tumor on the left measures greater than 7 centimeters largest dimension. Tumor encases the right upper lobe pulmonary artery. Multifocal rounded and lobular bilateral pulmonary nodules suggest metastatic disease, and there is a dominant 3.8 centimeter mass in the posterosuperior right upper lobe. Superimposed moderate to large layering left pleural effusion. Other neck: Asymmetric hyperenhancement of the right submandibular gland is of unclear etiology, but  there is no suspicious neck mass or lymphadenopathy identified. Aortic arch: 3 vessel arch configuration. Mild arch atherosclerosis. Right carotid system: Tortuous brachiocephalic artery and proximal right CCA. Mild for age right CCA atherosclerosis. Right ICA origin and bulb soft and calcified plaque resulting in less than 50 % stenosis with respect to the distal vessel. Left carotid system: No left CCA origin stenosis. Mild left CCA atherosclerosis without stenosis. Soft and calcified plaque in the left ICA origin resulting in 60 % stenosis with respect to the distal vessel best seen on series 10, image 108. No additional stenosis in the neck distal to the bulb. Vertebral arteries: Tortuous proximal right subclavian artery without plaque or stenosis. Normal right vertebral artery origin. Patent right vertebral artery to the skull base without stenosis. Mild proximal left subclavian and left vertebral artery origin atherosclerosis. Mild left vertebral artery origin stenosis. Tortuous left V1 segment. Patent left vertebral artery to the skull base without additional stenosis. CTA HEAD Posterior circulation: Bilateral V4 segment calcified plaque. Mild to moderate distal vertebral artery stenosis bilaterally with patent  vertebrobasilar junction. Proximal basilar bulky calcified plaque but only mild stenosis. Patent SCA origins. Normal right PCA origin. The left PCA origin is mildly obscured by streak artifact from the left ICA terminus vascular clip. Still, there is evidence of severe left PCA distal P1 and proximal P2 segment stenosis best seen on series 11, image 20. There is bilateral distal PCA enhancement. Posterior communicating arteries are diminutive or absent. Anterior circulation:  Both ICA siphons are patent. On the right there is moderate calcified plaque with moderate to severe supraclinoid stenosis. There is a superimposed 7 x 7 millimeter saccular aneurysm of the right ICA terminus directed posteriorly  and slightly inferiorly (right PCOM region). The right MCA and ACA origins remain normal. On the left there is moderate calcified plaque resulting in mild to moderate left siphon stenosis. There is a distal left ICA aneurysm clip with no aneurysmal enhancement identified. The left ICA terminus remains patent. The left MCA and ACA origins appear within normal limits. Bilateral ACA branches are within normal limits. Anterior communicating artery is diminutive or absent. Right MCA M1 segment, bifurcation, and right MCA branches are within normal limits. Left MCA M1 segment and left MCA branches appear patent but attenuated with asymmetric appearing left dural venous enhancement and evidence of a small left subdural hematoma (4-5 millimeters in thickness). No left MCA branch occlusion is identified. Venous sinuses: Patent. Anatomic variants: None. Delayed phase: Asymmetric left convexity dural venous enhancement appears related to the small subdural hematoma. No abnormal parenchymal enhancement identified to suggest intra-axial metastatic disease. Stable gray-white matter differentiation from earlier today. Review of the MIP images confirms the above findings IMPRESSION: 1. Negative for large vessel occlusion. But positive a for 4-5 mm left convexity Subdural Hematoma, which might explain an attenuated appearance of the left MCA branches - and could be a stroke mimic for symptoms in that hemisphere. 2. Positive also for partially visible bulky tumor in the mediastinum and bilateral pulmonary metastatic disease with large layering left pleural effusion. Lung cancer including small cell carcinoma would be a leading consideration. No strong evidence of metastatic disease to the brain. 3. 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 4. Additionally there is a 60% atherosclerotic  stenosis at the left ICA origin, but more advanced intracranial atherosclerosis including moderate to severe stenoses of the right ICA supraclinoid segment and the right PCA. And up to moderate stenosis of the left ICA siphon and both distal vertebral arteries. Salient findings on this exam discussed by telephone with Dr. Joni Fears in the ED on 08/23/2018 at 15:56 . Electronically Signed   By: Genevie Ann M.D.   On: 08/16/2018 16:04   US Carotid Bilateral (at [facility] Only)  Result Date: August 26, 2018 CLINICAL DATA:  Acute CVA EXAM: BILATERAL CAROTID DUPLEX ULTRASOUND TECHNIQUE: Pearline Cables scale imaging, color Doppler and duplex ultrasound were performed of bilateral carotid and vertebral arteries in the neck. COMPARISON:  None. FINDINGS: Criteria: Quantification of carotid stenosis is based on velocity parameters that correlate the residual internal carotid diameter with NASCET-based stenosis levels, using the diameter of the distal internal carotid lumen as the denominator for stenosis measurement. The following velocity measurements were obtained: RIGHT ICA: 96 cm/sec CCA: 64 cm/sec SYSTOLIC ICA/CCA RATIO:  1.5 ECA: 50 cm/sec LEFT ICA: 91 cm/sec CCA: 67 cm/sec SYSTOLIC ICA/CCA RATIO:  1.4 ECA: 60 cm/sec RIGHT CAROTID ARTERY:  Moderate irregular calcified plaque in the bulb. Low resistance internal carotid Doppler pattern. RIGHT VERTEBRAL ARTERY:  Antegrade. LEFT CAROTID ARTERY: Mild smooth calcified plaque in the bulb. Low resistance internal carotid Doppler pattern is preserved. LEFT VERTEBRAL ARTERY:  Antegrade. IMPRESSION: Less than 50% stenosis in the right and left internal carotid arteries. Electronically Signed   By: Marybelle Killings M.D.   On: 2018/08/24 09:32    PERFORMANCE STATUS (ECOG) : 4 - Bedbound  Review of Systems deferred  Physical Exam deferred  IMPRESSION: Work-up is ongoing.  Patient currently receiving EEG.  Repeat head CT is pending.  She has been evaluated by neurosurgery and felt not to  be a surgical candidate for her aneurysm.  Neurology is following.  I met today with patient's son and daughter.  Daughter spoke with Dr. Grayland Ormond earlier today regarding the lung mass.  Together, we reviewed patient's current medical problems and options for work-up and treatment.  Family verbalized understanding that the patient might not return to her previous baseline and that her performance status might limit options for work-up or treatment of the presumed malignancy.  They are appropriately tearful given the suddenness of her decline.  We discussed CODE STATUS and possible medical decisions that family might be facing.  Family are uncertain about decisions at the present time.  Patient does not have a healthcare power of attorney or living will.  Son and daughter would be her medical decision makers.  PLAN: Continue medical treatment Full code for now Will need to continue conversations regarding goals with family Will follow-up   Time Total: 50 minutes  Visit consisted of counseling and education dealing with the complex and emotionally intense issues of symptom management and palliative care in the setting of serious and potentially life-threatening illness.Greater than 50%  of this time was spent counseling and coordinating care related to the above assessment and plan.  Signed by: Altha Harm, PhD, NP-C [phone] (Work Cell)

## 2018-08-30 NOTE — Consult Note (Addendum)
Reason for Consult: Assistance with ventilator management and critical care issues after CODE BLUE  Referring Physician: Phillip Heal. Michaela Dunn is an 78 y.o. female.  HPI:  Patient is a very cachectic 78 year old brought to the intensive care unit after CODE BLUE.  The patient had a second [employer] after arrival to the ICU.  Appears she had a seizure on the floor and was intubated for airway protection afterwards developed severe bradycardia.  Subsequently developed PEA.  CODE BLUE situation activated.  Review of the notes from the day showed that the patient has been for the most part pretty unresponsive.  The patient presented initially on 10 December with altered mental status.  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  She is noted to be severely malnourished and the records has obtained substantial weight loss.  Post second CODE BLUE discussions were had with the patient's daughter patient is now DNR which is appropriate patient has remained unresponsive throughout the code and appears to have agonal respirations on the ventilator.  Blood pressure is not responding to pressors.  Past Medical History:  Diagnosis Date  . Depression   . Diabetes mellitus without complication ([place])   . Herpes zoster   . Hyperlipidemia   . Hypertension   . Knee pain, chronic     History reviewed. No pertinent surgical history.  Family History  Problem Relation Age of Onset  . Heart disease Father   . Stroke Father     Social History:  reports that she has never smoked. She has never used smokeless tobacco. Her alcohol and drug histories are not on file.  Allergies:  Allergies  Allergen Reactions  . Nsaids     Medications: I have reviewed the patient's current medications.  Results for orders placed or performed during the hospital encounter of 08/16/2018 (from the past 48  hour(s))  Glucose, capillary     Status: None   Collection Time: 08/12/2018  2:10 PM  Result Value Ref Range   Glucose-Capillary 70 70 - 99 mg/dL  Comprehensive metabolic panel     Status: Abnormal   Collection Time: 08/28/2018  2:13 PM  Result Value Ref Range   Sodium 136 135 - 145 mmol/L   Potassium 3.7 3.5 - 5.1 mmol/L   Chloride 101 98 - 111 mmol/L   CO2 24 22 - 32 mmol/L   Glucose, Bld 77 70 - 99 mg/dL   BUN 31 (H) 8 - 23 mg/dL   Creatinine, Ser 0.84 0.44 - 1.00 mg/dL   Calcium 8.3 (L) 8.9 - 10.3 mg/dL   Total Protein 8.1 6.5 - 8.1 g/dL   Albumin 2.5 (L) 3.5 - 5.0 g/dL   AST 27 15 - 41 U/L   ALT 14 0 - 44 U/L   Alkaline Phosphatase 73 38 - 126 U/L   Total Bilirubin 0.7 0.3 - 1.2 mg/dL   GFR calc non Af Amer >60 >60 mL/min   GFR calc Af Amer >60 >60 mL/min   Anion gap 11 5 - 15    Comment: Performed at [facility], [address] [place], [place] [address]  Troponin I - Once     Status: None   Collection Time: 08/15/2018  2:13 PM  Result Value Ref Range   Troponin I <0.03 <0.03 ng/mL    Comment: Performed at [facility], [address] [place], [place]  [address]  Lactic acid, plasma     Status: None   Collection Time: 08/24/2018  2:13 PM  Result Value Ref Range   Lactic Acid, Venous 1.9 0.5 - 1.9 mmol/L    Comment: Performed at [facility], [address] [place], [place] [address]  CBC with Differential     Status: Abnormal   Collection Time: 08/05/2018  2:13 PM  Result Value Ref Range   WBC 7.2 4.0 - 10.5 K/uL   RBC 3.31 (L) 3.87 - 5.11 MIL/uL   Hemoglobin 8.6 (L) 12.0 - 15.0 g/dL   HCT 28.5 (L) 36.0 - 46.0 %   MCV 86.1 80.0 - 100.0 fL   MCH 26.0 26.0 - 34.0 pg   MCHC 30.2 30.0 - 36.0 g/dL   RDW 17.2 (H) 11.5 - 15.5 %   Platelets 388 150 - 400 K/uL   nRBC 0.0 0.0 - 0.2 %   Neutrophils Relative % 71 %   Neutro Abs 5.1 1.7 - 7.7 K/uL   Lymphocytes Relative 17 %   Lymphs Abs 1.2 0.7 - 4.0 K/uL   Monocytes Relative 11 %   Monocytes  Absolute 0.8 0.1 - 1.0 K/uL   Eosinophils Relative 0 %   Eosinophils Absolute 0.0 0.0 - 0.5 K/uL   Basophils Relative 0 %   Basophils Absolute 0.0 0.0 - 0.1 K/uL   Immature Granulocytes 1 %   Abs Immature Granulocytes 0.04 0.00 - 0.07 K/uL    Comment: Performed at [facility], [address] [place], [place] [address]  Urinalysis, Complete w Microscopic     Status: Abnormal   Collection Time: 08/01/2018  2:13 PM  Result Value Ref Range   Color, Urine YELLOW (A) YELLOW   APPearance CLEAR (A) CLEAR   Specific Gravity, Urine 1.012 1.005 - 1.030   pH 6.0 5.0 - 8.0   Glucose, UA NEGATIVE NEGATIVE mg/dL   Hgb urine dipstick NEGATIVE NEGATIVE   Bilirubin Urine NEGATIVE NEGATIVE   Ketones, ur 5 (A) NEGATIVE mg/dL   Protein, ur 30 (A) NEGATIVE mg/dL   Nitrite NEGATIVE NEGATIVE   Leukocytes, UA NEGATIVE NEGATIVE   RBC / HPF 0-5 0 - 5 RBC/hpf   WBC, UA 0-5 0 - 5 WBC/hpf   Bacteria, UA NONE SEEN NONE SEEN   Squamous Epithelial / LPF 0-5 0 - 5   Mucus PRESENT     Comment: Performed at [facility], [address], [place], [place] [address]  Urine culture     Status: None   Collection Time: 08/15/2018  2:13 PM  Result Value Ref Range   Specimen Description      URINE, CATHETERIZED Performed at [facility], [address], [place], [place] [address]    Special Requests      NONE Performed at [facility], [address], [place], [place] [address]    Culture      NO GROWTH Performed at [facility], [place] [address], [place], [place] [address]    Report Status September 01, 2018 FINAL   Culture, blood (routine x 2)     Status: None (Preliminary result)   Collection Time: 08/15/2018  2:14 PM  Result Value Ref Range   Specimen Description BLOOD RIGHT ANTECUBITAL    Special Requests      BOTTLES DRAWN AEROBIC AND ANAEROBIC Blood Culture adequate volume   Culture      NO GROWTH < 24 HOURS Performed at [facility], [address],  [place], [place] [address]    Report Status PENDING   Culture, blood (routine x 2)     Status: None (Preliminary result)   Collection Time: 08/18/2018  2:14 PM  Result Value Ref Range   Specimen Description BLOOD BLOOD LEFT FOREARM    Special Requests      BOTTLES DRAWN AEROBIC AND ANAEROBIC Blood Culture results may not be optimal due to an inadequate volume of blood received in culture bottles   Culture      NO GROWTH < 24 HOURS Performed at [facility], [address], [place], [place] [address]    Report Status PENDING   Lactic acid, plasma     Status: None   Collection Time: 07/31/2018 11:18 PM  Result Value Ref Range   Lactic Acid, Venous 1.3 0.5 - 1.9 mmol/L    Comment: Performed at [facility], [address] [place], [place] [address]  Hemoglobin A1c     Status: None   Collection Time: August 19, 2018  6:37 AM  Result Value Ref Range   Hgb A1c MFr Bld 5.5 4.8 - 5.6 %    Comment: (NOTE) Pre diabetes:          5.7%-6.4% Diabetes:              >6.4% Glycemic control for   <7.0% adults with diabetes    Mean Plasma Glucose 111.15 mg/dL    Comment: Performed at [facility] [address] [place], [place] [address]  Lipid panel     Status: None   Collection Time: 19-Aug-2018  6:37 AM  Result Value Ref Range   Cholesterol 133 0 - 200 mg/dL   Triglycerides 65 <150 mg/dL   HDL 42 >40 mg/dL   Total CHOL/HDL Ratio 3.2 RATIO   VLDL 13 0 - 40 mg/dL   LDL Cholesterol 78 0 - 99 mg/dL    Comment:        Total Cholesterol/HDL:CHD Risk Coronary Heart Disease Risk Table                     Men   Women  1/2 Average Risk   3.4   3.3  Average Risk       5.0   4.4  2 X Average Risk   9.6   7.1  3 X Average Risk  23.4   11.0        Use the calculated Patient Ratio above and the CHD Risk Table to determine the patient's CHD Risk.        ATP III CLASSIFICATION (LDL):  <100     mg/dL   Optimal  100-129  mg/dL   Near or Above                    Optimal  130-159  mg/dL    Borderline  160-189  mg/dL   High  >190     mg/dL   Very High Performed at [place] [place], [place] [address]   Iron and TIBC     Status: Abnormal   Collection Time: 08-19-2018  6:37 AM  Result Value Ref Range   Iron 22 (L) 28 - 170 ug/dL   TIBC 152 (L) 250 - 450 ug/dL   Saturation Ratios 15 10.4 - 31.8 %   UIBC 130 ug/dL    Comment: Performed at [facility], [address], [place],  [address]  Ferritin  Status: None   Collection Time: August 18, 2018  6:37 AM  Result Value Ref Range   Ferritin 225 11 - 307 ng/mL    Comment: Performed at [facility], [address] [place], [place] [address]  Vitamin B12     Status: None   Collection Time: Aug 18, 2018  6:37 AM  Result Value Ref Range   Vitamin B-12 279 180 - 914 pg/mL    Comment: (NOTE) This assay is not validated for testing neonatal or myeloproliferative syndrome specimens for Vitamin B12 levels. Performed at [facility], [place] [address], [place], [place] [address]   Folate     Status: None   Collection Time: 08/18/2018  6:37 AM  Result Value Ref Range   Folate 29.0 >5.9 ng/mL    Comment: Performed at [facility], [place], [place] [address]  Glucose, capillary     Status: Abnormal   Collection Time: 08-18-18  7:40 PM  Result Value Ref Range   Glucose-Capillary 38 (LL) 70 - 99 mg/dL   Comment 1 Call MD NNP PA CNM   Glucose, capillary     Status: Abnormal   Collection Time: 08-18-18  7:45 PM  Result Value Ref Range   Glucose-Capillary 40 (LL) 70 - 99 mg/dL    Ct Angio Head W Or Wo Contrast  Result Date: 08/02/2018 CLINICAL DATA:  78 year old female with right side weakness, facial droop and loss of speech. Last known well 2030 hours yesterday. EXAM: CT ANGIOGRAPHY HEAD AND NECK TECHNIQUE: Multidetector CT imaging of the head and neck was performed using the standard protocol during bolus administration of intravenous contrast.  Multiplanar CT image reconstructions and MIPs were obtained to evaluate the vascular anatomy. Carotid stenosis measurements (when applicable) are obtained utilizing NASCET criteria, using the distal internal carotid diameter as the denominator. CONTRAST:  47mL OMNIPAQUE IOHEXOL 350 MG/ML SOLN COMPARISON:  Head CT without contrast 1439 hours. FINDINGS: CTA NECK Skeleton: Degenerative changes in the cervical spine. No destructive osseous lesion or acute osseous abnormality identified. Previous left frontotemporal craniotomy appears related to a left ICA terminus region aneurysm clipping. Upper chest: Abnormal. Partially visible bulky soft tissue tumor in the bilateral mediastinum. Anterior superior tumor on the left measures greater than 7 centimeters largest dimension. Tumor encases the right upper lobe pulmonary artery. Multifocal rounded and lobular bilateral pulmonary nodules suggest metastatic disease, and there is a dominant 3.8 centimeter mass in the posterosuperior right upper lobe. Superimposed moderate to large layering left pleural effusion. Other neck: Asymmetric hyperenhancement of the right submandibular gland is of unclear etiology, but there is no suspicious neck mass or lymphadenopathy identified. Aortic arch: 3 vessel arch configuration. Mild arch atherosclerosis. Right carotid system: Tortuous brachiocephalic artery and proximal right CCA. Mild for age right CCA atherosclerosis. Right ICA origin and bulb soft and calcified plaque resulting in less than 50 % stenosis with respect to the distal vessel. Left carotid system: No left CCA origin stenosis. Mild left CCA atherosclerosis without stenosis. Soft and calcified plaque in the left ICA origin resulting in 60 % stenosis with respect to the distal vessel best seen on series 10, image 108. No additional stenosis in the neck distal to the bulb. Vertebral arteries: Tortuous proximal right subclavian artery without plaque or stenosis. Normal right  vertebral artery origin. Patent right vertebral artery to the skull base without stenosis. Mild proximal left subclavian and left vertebral artery origin atherosclerosis. Mild left vertebral artery origin stenosis. Tortuous left V1 segment. Patent left  vertebral artery to the skull base without additional stenosis. CTA HEAD Posterior circulation: Bilateral V4 segment calcified plaque. Mild to moderate distal vertebral artery stenosis bilaterally with patent vertebrobasilar junction. Proximal basilar bulky calcified plaque but only mild stenosis. Patent SCA origins. Normal right PCA origin. The left PCA origin is mildly obscured by streak artifact from the left ICA terminus vascular clip. Still, there is evidence of severe left PCA distal P1 and proximal P2 segment stenosis best seen on series 11, image 20. There is bilateral distal PCA enhancement. Posterior communicating arteries are diminutive or absent. Anterior circulation:  Both ICA siphons are patent. On the right there is moderate calcified plaque with moderate to severe supraclinoid stenosis. 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 The right MCA and ACA origins remain normal. On the left there is moderate calcified plaque resulting in mild to moderate left siphon stenosis. There is a distal left ICA aneurysm clip with no aneurysmal enhancement identified. The left ICA terminus remains patent. The left MCA and ACA origins appear within normal limits. Bilateral ACA branches are within normal limits. Anterior communicating artery is diminutive or absent. Right MCA M1 segment, bifurcation, and right MCA branches are within normal limits. Left MCA M1 segment and left MCA branches appear patent but attenuated with asymmetric appearing left dural venous enhancement and evidence of a small left subdural hematoma (4-5 millimeters in thickness). No left MCA  branch occlusion is identified. Venous sinuses: Patent. Anatomic variants: None. Delayed phase: Asymmetric left convexity dural venous enhancement appears related to the small subdural hematoma. No abnormal parenchymal enhancement identified to suggest intra-axial metastatic disease. Stable gray-white matter differentiation from earlier today. Review of the MIP images confirms the above findings IMPRESSION: 1. Negative for large vessel occlusion. But positive a for 4-5 mm left convexity Subdural Hematoma, which might explain an attenuated appearance of the left MCA branches - and could be a stroke mimic for symptoms in that hemisphere. 2. Positive also for partially visible bulky tumor in the mediastinum and bilateral pulmonary metastatic disease with large layering left pleural effusion. Lung cancer including small cell carcinoma would be a leading consideration. No strong evidence of metastatic disease to the brain. 3. 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 4. 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 And up to moderate stenosis of the left ICA siphon and both distal vertebral arteries. Salient findings on this exam discussed by telephone with Dr. Joni Fears in the ED on 08/25/2018 at 15:56 . Electronically Signed   By: Genevie Ann M.D.   On: 08/10/2018 16:04   Dg Abd 1 View  Result Date: Aug 28, 2018 CLINICAL DATA:  Nasogastric tube placement. EXAM: ABDOMEN - 1 VIEW COMPARISON:  Portable chest obtained at the same time. FINDINGS: Nasogastric tube tip in the mid stomach and side hole in the proximal stomach on this view. Gaseous distention of the stomach and small bowel with borderline  dilatation of the proximal small bowel and normal caliber distal small bowel. Lumbar and lower thoracic spine degenerative changes. Diffuse osteopenia. IMPRESSION: 1. Nasogastric tube tip in the mid stomach and side hole in the proximal stomach.  2. Minimal proximal small bowel ileus or partial obstruction. Electronically Signed   By: Claudie Revering M.D.   On: 19-Aug-2018 20:36   Ct Head Wo Contrast  Result Date: 08-19-18 CLINICAL DATA:  Hemorrhage follow up EXAM: CT HEAD WITHOUT CONTRAST TECHNIQUE: Contiguous axial images were obtained from the base of the skull through the vertex without intravenous contrast. COMPARISON:  CTA head neck 08/05/2018 FINDINGS: Brain: Unchanged 7 mm left convexity subdural hematoma. No mass effect or midline shift. Size and configuration of the ventricles are unchanged. No new site of hemorrhage. White matter findings of chronic microvascular disease. No evidence of acute cortical infarct. Vascular: Previous clipping of left ICA aneurysm. Skull: Remote left pterional craniotomy. Sinuses/Orbits: No fluid levels or advanced mucosal thickening of the visualized paranasal sinuses. No mastoid or middle ear effusion. The orbits are normal. IMPRESSION: Unchanged 7 mm left convexity subdural hematoma without mass effect or midline shift. Electronically Signed   By: Ulyses Jarred M.D.   On: 19-Aug-2018 18:16   Ct Head Wo Contrast  Addendum Date: 07/30/2018   ADDENDUM REPORT: 08/23/2018 16:01 ADDENDUM: After reviewing the subsequent CTA head neck with Dr. Nevada Crane, neuroradiologist, there is evidence of a small isodense LEFT convexity subdural hematoma on the order of 6 mm in thickness without evidence of midline shift. There is mild effacement of cortical sulci in the LEFT frontal and parietal lobes. IMPRESSION: Small subacute LEFT convexity subdural hematoma without midline shift. Electronically Signed   By: Evangeline Dakin M.D.   On: 08/03/2018 16:01   Result Date: 08/29/2018 CLINICAL  DATA:  78 year old with aphasia, RIGHT-sided weakness and facial droop. Patient last seen normal at approximately 8:30 p.m. last night. Patient's family states that she fell from bed yesterday onto her wheelchair. Patient currently non-communicative. EXAM: CT HEAD WITHOUT CONTRAST TECHNIQUE: Contiguous axial images were obtained from the base of the skull through the vertex without intravenous contrast. COMPARISON:  None. FINDINGS: Brain: Severe head tilt in the gantry accounts for the apparent asymmetry in the cerebral hemispheres. Ventricular system normal in size and appearance for age. Mild age related cortical atrophy and mild deep atrophy. Moderate to severe cerebellar atrophy. Moderate changes of small vessel disease of the white matter diffusely. No mass lesion. No midline shift. No acute hemorrhage or hematoma. No extra-axial fluid collections. No evidence of acute infarction. Vascular: Apparent prior LEFT carotid trifurcation and/or MCA aneurysm with beam hardening streak artifact arising from the aneurysm clip. Moderate to severe BILATERAL carotid siphon atherosclerosis and severe vertebrobasilar atherosclerosis. No hyperdense vessel. Skull: Prior LEFT frontotemporal craniotomy. No skull fracture or other focal osseous abnormality involving the skull. Sinuses/Orbits: Visualized paranasal sinuses, bilateral mastoid air cells and bilateral middle ear cavities well-aerated. Visualized orbits and globes normal in appearance. Rightward gaze. Other: None. IMPRESSION: 1. No acute intracranial abnormality. 2. Mild age related cortical atrophy and moderate chronic microvascular ischemic changes of the white matter. Electronically Signed: By: Evangeline Dakin M.D. On: 08/28/2018 14:54   Ct Angio Neck W And/or Wo Contrast  Result Date: 08/26/2018 CLINICAL DATA:  78 year old female with right side weakness, facial droop and loss of speech. Last known well 2030 hours yesterday. EXAM: CT ANGIOGRAPHY HEAD AND NECK  TECHNIQUE: Multidetector CT imaging of the head and neck was performed using the standard protocol during bolus administration of intravenous contrast. Multiplanar CT image reconstructions and MIPs were obtained to evaluate the vascular anatomy. Carotid stenosis measurements (when applicable) are obtained utilizing NASCET criteria, using the distal internal carotid diameter as the  denominator. CONTRAST:  56mL OMNIPAQUE IOHEXOL 350 MG/ML SOLN COMPARISON:  Head CT without contrast 1439 hours. FINDINGS: CTA NECK Skeleton: Degenerative changes in the cervical spine. No destructive osseous lesion or acute osseous abnormality identified. Previous left frontotemporal craniotomy appears related to a left ICA terminus region aneurysm clipping. Upper chest: Abnormal. Partially visible bulky soft tissue tumor in the bilateral mediastinum. Anterior superior tumor on the left measures greater than 7 centimeters largest dimension. Tumor encases the right upper lobe pulmonary artery. Multifocal rounded and lobular bilateral pulmonary nodules suggest metastatic disease, and there is a dominant 3.8 centimeter mass in the posterosuperior right upper lobe. Superimposed moderate to large layering left pleural effusion. Other neck: Asymmetric hyperenhancement of the right submandibular gland is of unclear etiology, but there is no suspicious neck mass or lymphadenopathy identified. Aortic arch: 3 vessel arch configuration. Mild arch atherosclerosis. Right carotid system: Tortuous brachiocephalic artery and proximal right CCA. Mild for age right CCA atherosclerosis. Right ICA origin and bulb soft and calcified plaque resulting in less than 50 % stenosis with respect to the distal vessel. Left carotid system: No left CCA origin stenosis. Mild left CCA atherosclerosis without stenosis. Soft and calcified plaque in the left ICA origin resulting in 60 % stenosis with respect to the distal vessel best seen on series 10, image 108. No  additional stenosis in the neck distal to the bulb. Vertebral arteries: Tortuous proximal right subclavian artery without plaque or stenosis. Normal right vertebral artery origin. Patent right vertebral artery to the skull base without stenosis. Mild proximal left subclavian and left vertebral artery origin atherosclerosis. Mild left vertebral artery origin stenosis. Tortuous left V1 segment. Patent left vertebral artery to the skull base without additional stenosis. CTA HEAD Posterior circulation: Bilateral V4 segment calcified plaque. Mild to moderate distal vertebral artery stenosis bilaterally with patent vertebrobasilar junction. Proximal basilar bulky calcified plaque but only mild stenosis. Patent SCA origins. Normal right PCA origin. The left PCA origin is mildly obscured by streak artifact from the left ICA terminus vascular clip. Still, there is evidence of severe left PCA distal P1 and proximal P2 segment stenosis best seen on series 11, image 20. There is bilateral distal PCA enhancement. Posterior communicating arteries are diminutive or absent. Anterior circulation:  Both ICA siphons are patent. On the right there is moderate calcified plaque with moderate to severe supraclinoid stenosis. 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 The right MCA and ACA origins remain normal. On the left there is moderate calcified plaque resulting in mild to moderate left siphon stenosis. There is a distal left ICA aneurysm clip with no aneurysmal enhancement identified. The left ICA terminus remains patent. The left MCA and ACA origins appear within normal limits. Bilateral ACA branches are within normal limits. Anterior communicating artery is diminutive or absent. Right MCA M1 segment, bifurcation, and right MCA branches are within normal limits. Left MCA M1 segment and left MCA branches appear patent but  attenuated with asymmetric appearing left dural venous enhancement and evidence of a small left subdural hematoma (4-5 millimeters in thickness). No left MCA branch occlusion is identified. Venous sinuses: Patent. Anatomic variants: None. Delayed phase: Asymmetric left convexity dural venous enhancement appears related to the small subdural hematoma. No abnormal parenchymal enhancement identified to suggest intra-axial metastatic disease. Stable gray-white matter differentiation from earlier today. Review of the MIP images confirms the above findings IMPRESSION: 1. Negative for large  vessel occlusion. But positive a for 4-5 mm left convexity Subdural Hematoma, which might explain an attenuated appearance of the left MCA branches - and could be a stroke mimic for symptoms in that hemisphere. 2. Positive also for partially visible bulky tumor in the mediastinum and bilateral pulmonary metastatic disease with large layering left pleural effusion. Lung cancer including small cell carcinoma would be a leading consideration. No strong evidence of metastatic disease to the brain. 3. 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 4. 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 And up to moderate stenosis of the left ICA siphon and both distal vertebral arteries. Salient findings on this exam discussed by telephone with Dr. Joni Fears in the ED on 08/05/2018 at 15:56 . Electronically Signed   By: Genevie Ann M.D.   On: 08/14/2018 16:04   US Carotid Bilateral (at [facility] Only)  Result Date: 09-02-18 CLINICAL DATA:  Acute CVA EXAM: BILATERAL CAROTID DUPLEX ULTRASOUND TECHNIQUE: Pearline Cables scale imaging, color  Doppler and duplex ultrasound were performed of bilateral carotid and vertebral arteries in the neck. COMPARISON:  None. FINDINGS: Criteria: Quantification of carotid stenosis is based on velocity parameters that correlate the residual internal carotid diameter with NASCET-based stenosis levels, using the diameter of the distal internal carotid lumen as the denominator for stenosis measurement. The following velocity measurements were obtained: RIGHT ICA: 96 cm/sec CCA: 64 cm/sec SYSTOLIC ICA/CCA RATIO:  1.5 ECA: 50 cm/sec LEFT ICA: 91 cm/sec CCA: 67 cm/sec SYSTOLIC ICA/CCA RATIO:  1.4 ECA: 60 cm/sec RIGHT CAROTID ARTERY: Moderate irregular calcified plaque in the bulb. Low resistance internal carotid Doppler pattern. RIGHT VERTEBRAL ARTERY:  Antegrade. LEFT CAROTID ARTERY: Mild smooth calcified plaque in the bulb. Low resistance internal carotid Doppler pattern is preserved. LEFT VERTEBRAL ARTERY:  Antegrade. IMPRESSION: Less than 50% stenosis in the right and left internal carotid arteries. Electronically Signed   By: Marybelle Killings M.D.   On: 02-Sep-2018 09:32   Dg Chest Port 1 View  Result Date: 2018/09/02 CLINICAL DATA:  Status post intubation. EXAM: PORTABLE CHEST 1 VIEW COMPARISON:  None. FINDINGS: Endotracheal tube tip 2.2 cm above the carina. Nasogastric tube extending into the stomach with the side hole in the region of the gastroesophageal junction and tip not included. Dense airspace opacity in the medial aspect of the right upper lobe. Mild patchy airspace opacity in the left mid and lower lung zones. Minimal patchy airspace opacity in the right mid and lower lung zones. No visible pleural fluid. The left lateral costophrenic angle is not included. Lobulated soft tissue thickening in the mediastinum and hilar region on the left. Thoracic spine degenerative changes. IMPRESSION: 1. Endotracheal tube tip 2.2 cm above the carina. It is recommended that this be retracted 3 cm. 2. Nasogastric tube side hole  in the region of the gastroesophageal junction. 3. Bilateral pneumonia, most pronounced in the right upper lobe with some volume loss. This could be an indication of postobstructive changes. 4. Probable mediastinal and left hilar adenopathy. Electronically Signed   By: Claudie Revering M.D.   On: September 02, 2018 20:34    Review of Systems  Unable to perform ROS: Patient unresponsive   Blood pressure (!) 171/80, pulse 78, temperature 98  F (36.7 C), temperature source Oral, resp. rate 18, height 5\' 3"  (1.6 m), weight 43.1 kg, SpO2 98 %.  Blood pressure reflected was after epinephrine during code.  Currently patient is hypotensive spite pressors. Physical Exam  Constitutional: She appears cachectic. She appears ill. She is intubated.  Chronically ill-appearing, agonal respirations on the ventilator.  HENT:  Evidence of prior craniotomy.  Orotracheally intubated.  Eyes: No scleral icterus.  Postdilated/fixed.  Neck: Neck supple. No JVD present. No tracheal deviation present. No thyromegaly present.  Cardiovascular:  No murmur heard. Tachycardic after epinephrine during code.  Bundle branch block noted.  Respiratory: She is intubated.  Intubated, agonal respirations.  Coarse breath sounds throughout.  GI: Soft. She exhibits no distension.  Musculoskeletal: She exhibits no edema.  Severe muscle wasting.  Lymphadenopathy:    She has no cervical adenopathy.  Neurological:  Obtunded/unresponsive.  Flaccid extremities.  Skin:  Cool distally.  Psychiatric:  Responsive.    Assessment/Plan: 1.  Acute hypoxic respiratory failure due to CODE BLUE due to bradycardic/PEA arrest.  Patient has extensive tumor burden noted in the mediastinum.  She is currently in refractory shock.  Likelihood of survival is nil.  I have discussed with the patient's daughter and patient has been made DNR.  2.  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  3.  Subdural hematoma, altered mental  status.  This issue adds complexity to her management.  4.  Severe protein calorie malnutrition patient emaciated.  Family at bedside.  Suspect the patient will not survive very long.  We will continue ventilator support however noted, likelihood for survival is nil.   Critical care time: 60 minutes.    Vernard Gambles Aug 15, 2018, 9:20 PM

## 2018-08-30 NOTE — Progress Notes (Addendum)
Patient ID: Michaela Dunn, female   DOB: Feb 17, 1941, 78 y.o.   MRN: [id number]  CPR notes  First code went for 9 minutes.  Patient had bradycardia down we ended up giving epinephrine and because of the low sugar we ended up giving amps of D50.  Dopamine was started.  We were able to get a pulse and a blood pressure.  We were able to transfer the patient to the ICU.  The patient had a second code.  This ran for 3 minutes where he pushed an epinephrine dose and sodium bicarb.  We regained a pulse.  Dr. Patsey Berthold the critical care specialist spoke again with the daughter.  And made the patient DNR.  Spoke with the daughter at length prior to intubation and prior to the first code and again after the second code.  Overall prognosis is poor.  70 minutes total time  Dr. Loletha Grayer

## 2018-08-30 NOTE — Progress Notes (Signed)
*  PRELIMINARY RESULTS* Echocardiogram 2D Echocardiogram has been performed.  Michaela Dunn 09-08-2018, 11:17 AM

## 2018-08-30 NOTE — Procedures (Signed)
ELECTROENCEPHALOGRAM REPORT   Patient: Michaela Dunn       Room #: 120A-AA EEG No. ID: [id number] Age: 78 y.o.        Sex: female Referring Physician: Anselm Jungling Report Date:  08-21-18        Interpreting Physician: Alexis Goodell  History: Michaela Dunn is an 78 y.o. female with fall and altered mental status  Medications:  Lipitor, Aricept, Ferrous sulfate, Keppra, Remeron  Conditions of Recording:  This is a 21 channel routine scalp EEG performed with bipolar and monopolar montages arranged in accordance to the international 10/20 system of electrode placement. One channel was dedicated to EKG recording.  The patient is in the poorly responsive state.  Description:  The background activity is asymmetric.  The right hemisphere is of higher voltage.  A breech rhythm is noted with embedded sharp activity noted.  Over the left hemisphere the activity noted is slow and poorly organized.  There is noted a mixture of polymorphic delta activity and theta activity.  This activity is continuous.  Hyperventilation and intermittent photic stimulation were not performed.  IMPRESSION: This is an abnormal electroencephalogram secondary to slowing and a right breech rhythm that is consistent with the patient's history of craniotomy.     Alexis Goodell, MD Neurology [phone] 08-21-2018, 3:29 PM

## 2018-08-30 NOTE — Progress Notes (Addendum)
Patient ID: Michaela Dunn, female   DOB: [birth year], 78 y.o.   MRN: [id number]   [place] PROGRESS NOTE  ANE CONERLY YTK:[id number] DOB: 10/28/1940 DOA: 07/30/2018 PCP: Marguerita Merles, MD  HPI/Subjective: Called by nursing staff for seizure and altered mental status.  When I arrived to the room the patient was agonal breathing and using accessory muscles to breathe and they just placed her on 100% nonrebreather.  I confirm full CODE STATUS with the daughter who is present at the bedside.  I advised the team that we needed to intubate.  Objective: Vitals:   09/02/18 1613 09/02/18 1901  BP: 112/62 (!) 171/80  Pulse: 78   Resp: 18   Temp: 98.7 F (37.1 C) 98 F (36.7 C)  SpO2: 98%     Intake/Output Summary (Last 24 hours) at 09-02-18 1957 Last data filed at 09-02-18 0352 Gross per 24 hour  Intake 750.09 ml  Output -  Net 750.09 ml   Filed Weights   08/07/2018 1355  Weight: 43.1 kg    ROS: Review of Systems  Unable to perform ROS: Acuity of condition   Exam: Physical Exam  HENT:  Nose: No mucosal edema.  Unable to look into mouth  Eyes: Lids are normal.  Pupils pinpoint  Neck: Carotid bruit is not present.  Cardiovascular: Regular rhythm.  Respiratory: Accessory muscle usage present. She is in respiratory distress. She has decreased breath sounds in the right middle field, the right lower field, the left middle field and the left lower field. She has wheezes in the right lower field and the left lower field.  GI: Soft. There is no tenderness.  Musculoskeletal:       Right ankle: She exhibits no swelling.       Left ankle: She exhibits no swelling.  Neurological:  Unresponsive to sternal rub  Skin: Skin is warm. No rash noted.  Psychiatric:  Unresponsive      Data Reviewed: Basic Metabolic Panel: Recent Labs  Lab 08/21/2018 1413  NA 136  K 3.7  CL 101  CO2 24  GLUCOSE 77  BUN 31*  CREATININE 0.84  CALCIUM 8.3*   Liver Function  Tests: Recent Labs  Lab 08/10/2018 1413  AST 27  ALT 14  ALKPHOS 73  BILITOT 0.7  PROT 8.1  ALBUMIN 2.5*   No results for input(s): LIPASE, AMYLASE in the last 168 hours. No results for input(s): AMMONIA in the last 168 hours. CBC: Recent Labs  Lab 08/16/2018 1413  WBC 7.2  NEUTROABS 5.1  HGB 8.6*  HCT 28.5*  MCV 86.1  PLT 388   Cardiac Enzymes: Recent Labs  Lab 08/10/2018 1413  TROPONINI <0.03    CBG: Recent Labs  Lab 08/19/2018 1410 2018/09/02 1940  GLUCAP 70 38*    Recent Results (from the past 240 hour(s))  Urine culture     Status: None   Collection Time: 08/04/2018  2:13 PM  Result Value Ref Range Status   Specimen Description   Final    URINE, CATHETERIZED Performed at [facility], [address], [place], [place] [address]    Special Requests   Final    NONE Performed at [facility], [address], [place], [place] [address]    Culture   Final    NO GROWTH Performed at [facility], [place] [address], [place], [place] [address]    Report Status 02-Sep-2018 FINAL  Final  Culture, blood (routine x 2)  Status: None (Preliminary result)   Collection Time: 08/12/2018  2:14 PM  Result Value Ref Range Status   Specimen Description BLOOD RIGHT ANTECUBITAL  Final   Special Requests   Final    BOTTLES DRAWN AEROBIC AND ANAEROBIC Blood Culture adequate volume   Culture   Final    NO GROWTH < 24 HOURS Performed at [facility], [address] [place], [place] [address]    Report Status PENDING  Incomplete  Culture, blood (routine x 2)     Status: None (Preliminary result)   Collection Time: 08/18/2018  2:14 PM  Result Value Ref Range Status   Specimen Description BLOOD BLOOD LEFT FOREARM  Final   Special Requests   Final    BOTTLES DRAWN AEROBIC AND ANAEROBIC Blood Culture results may not be optimal due to an inadequate volume of blood received in culture bottles   Culture   Final    NO GROWTH < 24 HOURS Performed at  [facility], [address], [place], [place] [address]    Report Status PENDING  Incomplete     Studies: Ct Angio Head W Or Wo Contrast  Result Date: 07/31/2018 CLINICAL DATA:  78 year old female with right side weakness, facial droop and loss of speech. Last known well 2030 hours yesterday. EXAM: CT ANGIOGRAPHY HEAD AND NECK TECHNIQUE: Multidetector CT imaging of the head and neck was performed using the standard protocol during bolus administration of intravenous contrast. Multiplanar CT image reconstructions and MIPs were obtained to evaluate the vascular anatomy. Carotid stenosis measurements (when applicable) are obtained utilizing NASCET criteria, using the distal internal carotid diameter as the denominator. CONTRAST:  25mL OMNIPAQUE IOHEXOL 350 MG/ML SOLN COMPARISON:  Head CT without contrast 1439 hours. FINDINGS: CTA NECK Skeleton: Degenerative changes in the cervical spine. No destructive osseous lesion or acute osseous abnormality identified. Previous left frontotemporal craniotomy appears related to a left ICA terminus region aneurysm clipping. Upper chest: Abnormal. Partially visible bulky soft tissue tumor in the bilateral mediastinum. Anterior superior tumor on the left measures greater than 7 centimeters largest dimension. Tumor encases the right upper lobe pulmonary artery. Multifocal rounded and lobular bilateral pulmonary nodules suggest metastatic disease, and there is a dominant 3.8 centimeter mass in the posterosuperior right upper lobe. Superimposed moderate to large layering left pleural effusion. Other neck: Asymmetric hyperenhancement of the right submandibular gland is of unclear etiology, but there is no suspicious neck mass or lymphadenopathy identified. Aortic arch: 3 vessel arch configuration. Mild arch atherosclerosis. Right carotid system: Tortuous brachiocephalic artery and proximal right CCA. Mild for age right CCA atherosclerosis. Right ICA origin and bulb  soft and calcified plaque resulting in less than 50 % stenosis with respect to the distal vessel. Left carotid system: No left CCA origin stenosis. Mild left CCA atherosclerosis without stenosis. Soft and calcified plaque in the left ICA origin resulting in 60 % stenosis with respect to the distal vessel best seen on series 10, image 108. No additional stenosis in the neck distal to the bulb. Vertebral arteries: Tortuous proximal right subclavian artery without plaque or stenosis. Normal right vertebral artery origin. Patent right vertebral artery to the skull base without stenosis. Mild proximal left subclavian and left vertebral artery origin atherosclerosis. Mild left vertebral artery origin stenosis. Tortuous left V1 segment. Patent left vertebral artery to the skull base without additional stenosis. CTA HEAD Posterior circulation: Bilateral V4 segment calcified plaque. Mild to moderate distal vertebral artery stenosis bilaterally with patent vertebrobasilar junction. Proximal basilar bulky  calcified plaque but only mild stenosis. Patent SCA origins. Normal right PCA origin. The left PCA origin is mildly obscured by streak artifact from the left ICA terminus vascular clip. Still, there is evidence of severe left PCA distal P1 and proximal P2 segment stenosis best seen on series 11, image 20. There is bilateral distal PCA enhancement. Posterior communicating arteries are diminutive or absent. Anterior circulation:  Both ICA siphons are patent. On the right there is moderate calcified plaque with moderate to severe supraclinoid stenosis. 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 The right MCA and ACA origins remain normal. On the left there is moderate calcified plaque resulting in mild to moderate left siphon stenosis. There is a distal left ICA aneurysm clip with no aneurysmal enhancement identified. The  left ICA terminus remains patent. The left MCA and ACA origins appear within normal limits. Bilateral ACA branches are within normal limits. Anterior communicating artery is diminutive or absent. Right MCA M1 segment, bifurcation, and right MCA branches are within normal limits. Left MCA M1 segment and left MCA branches appear patent but attenuated with asymmetric appearing left dural venous enhancement and evidence of a small left subdural hematoma (4-5 millimeters in thickness). No left MCA branch occlusion is identified. Venous sinuses: Patent. Anatomic variants: None. Delayed phase: Asymmetric left convexity dural venous enhancement appears related to the small subdural hematoma. No abnormal parenchymal enhancement identified to suggest intra-axial metastatic disease. Stable gray-white matter differentiation from earlier today. Review of the MIP images confirms the above findings IMPRESSION: 1. Negative for large vessel occlusion. But positive a for 4-5 mm left convexity Subdural Hematoma, which might explain an attenuated appearance of the left MCA branches - and could be a stroke mimic for symptoms in that hemisphere. 2. Positive also for partially visible bulky tumor in the mediastinum and bilateral pulmonary metastatic disease with large layering left pleural effusion. Lung cancer including small cell carcinoma would be a leading consideration. No strong evidence of metastatic disease to the brain. 3. 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 4. 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 And up to moderate stenosis of the left ICA siphon and both distal vertebral arteries. Salient  findings on this exam discussed by telephone with Dr. Joni Fears in the ED on 08/28/2018 at 15:56 . Electronically Signed   By: Genevie Ann M.D.   On: 08/28/2018 16:04   Ct Head Wo Contrast  Result Date: 2018/08/30 CLINICAL DATA:  Hemorrhage follow up EXAM: CT HEAD WITHOUT CONTRAST TECHNIQUE: Contiguous axial images were obtained from the base of the skull through the vertex without intravenous contrast. COMPARISON:  CTA head neck 08/25/2018 FINDINGS: Brain: Unchanged 7 mm left convexity subdural hematoma. No mass effect or midline shift. Size and configuration of the ventricles are unchanged. No new site of hemorrhage. White matter findings of chronic microvascular disease. No evidence of acute cortical infarct. Vascular: Previous clipping of left ICA aneurysm. Skull: Remote left pterional craniotomy. Sinuses/Orbits: No fluid levels or advanced mucosal thickening of the visualized paranasal sinuses. No mastoid or middle ear effusion. The orbits are normal. IMPRESSION: Unchanged 7 mm left convexity subdural  hematoma without mass effect or midline shift. Electronically Signed   By: Ulyses Jarred M.D.   On: 09/02/2018 18:16   Ct Head Wo Contrast  Addendum Date: 08/17/2018   ADDENDUM REPORT: 08/23/2018 16:01 ADDENDUM: After reviewing the subsequent CTA head neck with Dr. Nevada Crane, neuroradiologist, there is evidence of a small isodense LEFT convexity subdural hematoma on the order of 6 mm in thickness without evidence of midline shift. There is mild effacement of cortical sulci in the LEFT frontal and parietal lobes. IMPRESSION: Small subacute LEFT convexity subdural hematoma without midline shift. Electronically Signed   By: Evangeline Dakin M.D.   On: 08/05/2018 16:01   Result Date: 08/04/2018 CLINICAL DATA:  78 year old with aphasia, RIGHT-sided weakness and facial droop. Patient last seen normal at approximately 8:30 p.m. last night. Patient's family states that she fell from bed yesterday onto her wheelchair.  Patient currently non-communicative. EXAM: CT HEAD WITHOUT CONTRAST TECHNIQUE: Contiguous axial images were obtained from the base of the skull through the vertex without intravenous contrast. COMPARISON:  None. FINDINGS: Brain: Severe head tilt in the gantry accounts for the apparent asymmetry in the cerebral hemispheres. Ventricular system normal in size and appearance for age. Mild age related cortical atrophy and mild deep atrophy. Moderate to severe cerebellar atrophy. Moderate changes of small vessel disease of the white matter diffusely. No mass lesion. No midline shift. No acute hemorrhage or hematoma. No extra-axial fluid collections. No evidence of acute infarction. Vascular: Apparent prior LEFT carotid trifurcation and/or MCA aneurysm with beam hardening streak artifact arising from the aneurysm clip. Moderate to severe BILATERAL carotid siphon atherosclerosis and severe vertebrobasilar atherosclerosis. No hyperdense vessel. Skull: Prior LEFT frontotemporal craniotomy. No skull fracture or other focal osseous abnormality involving the skull. Sinuses/Orbits: Visualized paranasal sinuses, bilateral mastoid air cells and bilateral middle ear cavities well-aerated. Visualized orbits and globes normal in appearance. Rightward gaze. Other: None. IMPRESSION: 1. No acute intracranial abnormality. 2. Mild age related cortical atrophy and moderate chronic microvascular ischemic changes of the white matter. Electronically Signed: By: Evangeline Dakin M.D. On: 08/04/2018 14:54   Ct Angio Neck W And/or Wo Contrast  Result Date: 08/24/2018 CLINICAL DATA:  78 year old female with right side weakness, facial droop and loss of speech. Last known well 2030 hours yesterday. EXAM: CT ANGIOGRAPHY HEAD AND NECK TECHNIQUE: Multidetector CT imaging of the head and neck was performed using the standard protocol during bolus administration of intravenous contrast. Multiplanar CT image reconstructions and MIPs were obtained to  evaluate the vascular anatomy. Carotid stenosis measurements (when applicable) are obtained utilizing NASCET criteria, using the distal internal carotid diameter as the denominator. CONTRAST:  35mL OMNIPAQUE IOHEXOL 350 MG/ML SOLN COMPARISON:  Head CT without contrast 1439 hours. FINDINGS: CTA NECK Skeleton: Degenerative changes in the cervical spine. No destructive osseous lesion or acute osseous abnormality identified. Previous left frontotemporal craniotomy appears related to a left ICA terminus region aneurysm clipping. Upper chest: Abnormal. Partially visible bulky soft tissue tumor in the bilateral mediastinum. Anterior superior tumor on the left measures greater than 7 centimeters largest dimension. Tumor encases the right upper lobe pulmonary artery. Multifocal rounded and lobular bilateral pulmonary nodules suggest metastatic disease, and there is a dominant 3.8 centimeter mass in the posterosuperior right upper lobe. Superimposed moderate to large layering left pleural effusion. Other neck: Asymmetric hyperenhancement of the right submandibular gland is of unclear etiology, but there is no suspicious neck mass or lymphadenopathy identified. Aortic arch: 3 vessel arch configuration. Mild arch atherosclerosis. Right carotid system:  Tortuous brachiocephalic artery and proximal right CCA. Mild for age right CCA atherosclerosis. Right ICA origin and bulb soft and calcified plaque resulting in less than 50 % stenosis with respect to the distal vessel. Left carotid system: No left CCA origin stenosis. Mild left CCA atherosclerosis without stenosis. Soft and calcified plaque in the left ICA origin resulting in 60 % stenosis with respect to the distal vessel best seen on series 10, image 108. No additional stenosis in the neck distal to the bulb. Vertebral arteries: Tortuous proximal right subclavian artery without plaque or stenosis. Normal right vertebral artery origin. Patent right vertebral artery to the skull  base without stenosis. Mild proximal left subclavian and left vertebral artery origin atherosclerosis. Mild left vertebral artery origin stenosis. Tortuous left V1 segment. Patent left vertebral artery to the skull base without additional stenosis. CTA HEAD Posterior circulation: Bilateral V4 segment calcified plaque. Mild to moderate distal vertebral artery stenosis bilaterally with patent vertebrobasilar junction. Proximal basilar bulky calcified plaque but only mild stenosis. Patent SCA origins. Normal right PCA origin. The left PCA origin is mildly obscured by streak artifact from the left ICA terminus vascular clip. Still, there is evidence of severe left PCA distal P1 and proximal P2 segment stenosis best seen on series 11, image 20. There is bilateral distal PCA enhancement. Posterior communicating arteries are diminutive or absent. Anterior circulation:  Both ICA siphons are patent. On the right there is moderate calcified plaque with moderate to severe supraclinoid stenosis. 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 The right MCA and ACA origins remain normal. On the left there is moderate calcified plaque resulting in mild to moderate left siphon stenosis. There is a distal left ICA aneurysm clip with no aneurysmal enhancement identified. The left ICA terminus remains patent. The left MCA and ACA origins appear within normal limits. Bilateral ACA branches are within normal limits. Anterior communicating artery is diminutive or absent. Right MCA M1 segment, bifurcation, and right MCA branches are within normal limits. Left MCA M1 segment and left MCA branches appear patent but attenuated with asymmetric appearing left dural venous enhancement and evidence of a small left subdural hematoma (4-5 millimeters in thickness). No left MCA branch occlusion is identified. Venous sinuses: Patent. Anatomic  variants: None. Delayed phase: Asymmetric left convexity dural venous enhancement appears related to the small subdural hematoma. No abnormal parenchymal enhancement identified to suggest intra-axial metastatic disease. Stable gray-white matter differentiation from earlier today. Review of the MIP images confirms the above findings IMPRESSION: 1. Negative for large vessel occlusion. But positive a for 4-5 mm left convexity Subdural Hematoma, which might explain an attenuated appearance of the left MCA branches - and could be a stroke mimic for symptoms in that hemisphere. 2. Positive also for partially visible bulky tumor in the mediastinum and bilateral pulmonary metastatic disease with large layering left pleural effusion. Lung cancer including small cell carcinoma would be a leading consideration. No strong evidence of metastatic disease to the brain. 3. 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 4. 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 And up to moderate stenosis of the left ICA  siphon and both distal vertebral arteries. Salient findings on this exam discussed by telephone with Dr. Joni Fears in the ED on 07/30/2018 at 15:56 . Electronically Signed   By: Genevie Ann M.D.   On: 08/11/2018 16:04   US Carotid Bilateral (at [facility] Only)  Result Date: Aug 26, 2018 CLINICAL DATA:  Acute CVA EXAM: BILATERAL CAROTID DUPLEX ULTRASOUND TECHNIQUE: Pearline Cables scale imaging, color Doppler and duplex ultrasound were performed of bilateral carotid and vertebral arteries in the neck. COMPARISON:  None. FINDINGS: Criteria: Quantification of carotid stenosis is based on velocity parameters that correlate the  residual internal carotid diameter with NASCET-based stenosis levels, using the diameter of the distal internal carotid lumen as the denominator for stenosis measurement. The following velocity measurements were obtained: RIGHT ICA: 96 cm/sec CCA: 64 cm/sec SYSTOLIC ICA/CCA RATIO:  1.5 ECA: 50 cm/sec LEFT ICA: 91 cm/sec CCA: 67 cm/sec SYSTOLIC ICA/CCA RATIO:  1.4 ECA: 60 cm/sec RIGHT CAROTID ARTERY: Moderate irregular calcified plaque in the bulb. Low resistance internal carotid Doppler pattern. RIGHT VERTEBRAL ARTERY:  Antegrade. LEFT CAROTID ARTERY: Mild smooth calcified plaque in the bulb. Low resistance internal carotid Doppler pattern is preserved. LEFT VERTEBRAL ARTERY:  Antegrade. IMPRESSION: Less than 50% stenosis in the right and left internal carotid arteries. Electronically Signed   By: Marybelle Killings M.D.   On: 08-26-2018 09:32    Scheduled Meds: . atorvastatin  40 mg Oral Daily  . dextrose      . donepezil  10 mg Oral QHS  . fentaNYL      . fentaNYL (SUBLIMAZE) injection  100 mcg Intravenous Once  . ferrous sulfate  325 mg Oral Daily  . LORazepam      . mouth rinse  15 mL Mouth Rinse BID  . midazolam      . midazolam  2 mg Intravenous Once  . mirtazapine  7.5 mg Oral QHS  . naloxone       Continuous Infusions: . sodium chloride 75 mL/hr at 08-26-2018 1122  . levETIRAcetam 500 mg (2018/08/26 1121)    Assessment/Plan:  1.  Respiratory arrest.  Decision made to intubate.  Respiratory therapist tried bagging and intubation twice.  Needed to call the ER physician Dr. Georgiana Spinner to intubate.  He intubated with kaleidoscope.  The patient received 100 mg of fentanyl, 2 mg of Versed, 10 mg of etomidate and 100 mg succinylcholine. 2.  After intubation the patient had a cardiac arrest and CPR was needed.  We are able to regain a pulse after less than 10 minutes of CPR and 2 doses of epinephrine.  Patient was transferred to the ICU and case was discussed with critical care specialist. 3.  Seizure.   Patient was started on Keppra.  PRN Ativan. 3.  Bulky tumor in the mediastinum and pulmonary metastatic disease. 4.  Subdural hematoma.  Overall prognosis poor  Code Status:     Code Status Orders  (From admission, onward)         Start     Ordered   08/07/2018 2253  Full code  Continuous     08/14/2018 2252        Code Status History    This patient has a current code status but no historical code status.     Family Communication: Spoke with daughter at the bedside   Time spent: 70 minutes critical care time    [employer]

## 2018-08-30 NOTE — Progress Notes (Signed)
[facility] Progress Note Patient Name: Michaela Dunn DOB: [birth year] MRN: [id number]   Date of Service  2018-08-29  HPI/Events of Note  77/F with dementia, new mediastinal mass with bilateral lung lesions suspicious for malignancy, admitted to the floors.  Pt had a seizure, given Versed and then had a respiratory arrest.  Pt also received Fentanyl prior. Code blue was called. Pt had difficult intubation.   Pt was subsequently intubated.  Pt then went into cardiac arrest. CPR was initiated and ROSC was obtained.   Family has decided to make the patient DNR and no further escalation of care.  eICU Interventions  Continue to monitor.  Pressors are off.  Pt remains on the vent.       Intervention Category Evaluation Type: New Patient Evaluation  Elsie Lincoln 08-29-18, 9:16 PM

## 2018-08-30 NOTE — Consult Note (Signed)
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  INTERVAL HISTORY: Patient is a 78 year old female who presented to the emergency room with strokelike symptoms.  Subsequent work-up included CT of the head and neck which incidentally noted a large mediastinal mass with bilateral nodules highly suspicious for underlying metastatic lung disease.  Patient is currently unresponsive.  Family at bedside.  REVIEW OF SYSTEMS:   Review of Systems  Unable to perform ROS: Acuity of condition    PAST MEDICAL HISTORY: Past Medical History:  Diagnosis Date  . Depression   . Diabetes mellitus without complication ([place])   . Herpes zoster   . Hyperlipidemia   . Hypertension   . Knee pain, chronic     PAST SURGICAL HISTORY: History reviewed. No pertinent surgical history.  FAMILY HISTORY: No family history on file.  ADVANCED DIRECTIVES (Y/N):  @ADVDIR @  HEALTH MAINTENANCE: Social History   Tobacco Use  . Smoking status: Never Smoker  . Smokeless tobacco: Never Used  Substance Use Topics  . Alcohol use: Not on file  . Drug use: Not on file     Colonoscopy:  PAP:  Bone density:  Lipid panel:  Allergies  Allergen Reactions  . Nsaids     Current Facility-Administered Medications  Medication Dose Route Frequency Provider Last Rate Last Dose  . 0.9 %  sodium chloride infusion   Intravenous Continuous Amelia Jo, MD 75 mL/hr at 08-22-18 1122    . acetaminophen (TYLENOL) tablet 650 mg  650 mg Oral Q4H PRN Amelia Jo, MD       Or  . acetaminophen (TYLENOL) solution 650 mg  650 mg Per Tube Q4H PRN Amelia Jo, MD       Or  . acetaminophen (TYLENOL) suppository 650 mg  650 mg Rectal Q4H PRN  Amelia Jo, MD      . atorvastatin (LIPITOR) tablet 40 mg  40 mg Oral Daily Amelia Jo, MD      . donepezil (ARICEPT) tablet 10 mg  10 mg Oral QHS Amelia Jo, MD      . ferrous sulfate tablet 325 mg  325 mg Oral Daily Amelia Jo, MD      . levETIRAcetam (KEPPRA) 500 mg in sodium chloride 0.9 % 100 mL IVPB  500 mg Intravenous Q12H Amelia Jo, MD 420 mL/hr at 22-Aug-2018 1121 500 mg at Aug 22, 2018 1121  . MEDLINE mouth rinse  15 mL Mouth Rinse BID Amelia Jo, MD   15 mL at 2018-08-22 1122  . mirtazapine (REMERON) tablet 7.5 mg  7.5 mg Oral QHS Amelia Jo, MD      . senna-docusate (Senokot-S) tablet 1 tablet  1 tablet Oral QHS PRN Amelia Jo, MD        OBJECTIVE: Vitals:   08-22-2018 0522 August 22, 2018 0800  BP: (!) 115/55 114/61  Pulse: 88 84  Resp: 20 20  Temp: 98.5 F (36.9 C) 98.6 F (37 C)  SpO2: 100% 96%     Body mass index is 16.83 kg/m.    ECOG FS:4 - Bedbound  General: Thin, no acute distress. Eyes: Pink conjunctiva, anicteric sclera. HEENT: Normocephalic. Lungs: Clear to auscultation bilaterally. Heart: Regular rate and rhythm. No rubs, murmurs, or gallops. Abdomen: Soft, nontender, nondistended. No organomegaly noted, normoactive bowel sounds. Musculoskeletal: No  edema, cyanosis, or clubbing. Neuro: Unresponsive.   Skin: No rashes or petechiae noted.   LAB RESULTS:  Lab Results  Component Value Date   NA 136 08/22/2018   K 3.7 08/06/2018   CL 101 08/11/2018   CO2 24 07/31/2018   GLUCOSE 77 08/14/2018   BUN 31 (H) 08/23/2018   CREATININE 0.84 08/07/2018   CALCIUM 8.3 (L) 08/20/2018   PROT 8.1 08/03/2018   ALBUMIN 2.5 (L) 08/05/2018   AST 27 08/28/2018   ALT 14 08/12/2018   ALKPHOS 73 08/19/2018   BILITOT 0.7 08/21/2018   GFRNONAA >60 08/12/2018   GFRAA >60 08/13/2018    Lab Results  Component Value Date   WBC 7.2 08/14/2018   NEUTROABS 5.1 08/18/2018   HGB 8.6 (L) 08/05/2018   HCT 28.5 (L) 08/07/2018   MCV 86.1 08/05/2018   PLT 388  08/21/2018     STUDIES: Ct Angio Head W Or Wo Contrast  Result Date: 08/10/2018 CLINICAL DATA:  78 year old female with right side weakness, facial droop and loss of speech. Last known well 2030 hours yesterday. EXAM: CT ANGIOGRAPHY HEAD AND NECK TECHNIQUE: Multidetector CT imaging of the head and neck was performed using the standard protocol during bolus administration of intravenous contrast. Multiplanar CT image reconstructions and MIPs were obtained to evaluate the vascular anatomy. Carotid stenosis measurements (when applicable) are obtained utilizing NASCET criteria, using the distal internal carotid diameter as the denominator. CONTRAST:  67mL OMNIPAQUE IOHEXOL 350 MG/ML SOLN COMPARISON:  Head CT without contrast 1439 hours. FINDINGS: CTA NECK Skeleton: Degenerative changes in the cervical spine. No destructive osseous lesion or acute osseous abnormality identified. Previous left frontotemporal craniotomy appears related to a left ICA terminus region aneurysm clipping. Upper chest: Abnormal. Partially visible bulky soft tissue tumor in the bilateral mediastinum. Anterior superior tumor on the left measures greater than 7 centimeters largest dimension. Tumor encases the right upper lobe pulmonary artery. Multifocal rounded and lobular bilateral pulmonary nodules suggest metastatic disease, and there is a dominant 3.8 centimeter mass in the posterosuperior right upper lobe. Superimposed moderate to large layering left pleural effusion. Other neck: Asymmetric hyperenhancement of the right submandibular gland is of unclear etiology, but there is no suspicious neck mass or lymphadenopathy identified. Aortic arch: 3 vessel arch configuration. Mild arch atherosclerosis. Right carotid system: Tortuous brachiocephalic artery and proximal right CCA. Mild for age right CCA atherosclerosis. Right ICA origin and bulb soft and calcified plaque resulting in less than 50 % stenosis with respect to the distal vessel.  Left carotid system: No left CCA origin stenosis. Mild left CCA atherosclerosis without stenosis. Soft and calcified plaque in the left ICA origin resulting in 60 % stenosis with respect to the distal vessel best seen on series 10, image 108. No additional stenosis in the neck distal to the bulb. Vertebral arteries: Tortuous proximal right subclavian artery without plaque or stenosis. Normal right vertebral artery origin. Patent right vertebral artery to the skull base without stenosis. Mild proximal left subclavian and left vertebral artery origin atherosclerosis. Mild left vertebral artery origin stenosis. Tortuous left V1 segment. Patent left vertebral artery to the skull base without additional stenosis. CTA HEAD Posterior circulation: Bilateral V4 segment calcified plaque. Mild to moderate distal vertebral artery stenosis bilaterally with patent vertebrobasilar junction. Proximal basilar bulky calcified plaque but only mild stenosis. Patent SCA origins. Normal right PCA origin. The left PCA origin is mildly obscured by streak artifact from the left ICA terminus vascular clip. Still, there is evidence of severe  left PCA distal P1 and proximal P2 segment stenosis best seen on series 11, image 20. There is bilateral distal PCA enhancement. Posterior communicating arteries are diminutive or absent. Anterior circulation:  Both ICA siphons are patent. On the right there is moderate calcified plaque with moderate to severe supraclinoid stenosis. 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 The right MCA and ACA origins remain normal. On the left there is moderate calcified plaque resulting in mild to moderate left siphon stenosis. There is a distal left ICA aneurysm clip with no aneurysmal enhancement identified. The left ICA terminus remains patent. The left MCA and ACA origins appear within normal limits.  Bilateral ACA branches are within normal limits. Anterior communicating artery is diminutive or absent. Right MCA M1 segment, bifurcation, and right MCA branches are within normal limits. Left MCA M1 segment and left MCA branches appear patent but attenuated with asymmetric appearing left dural venous enhancement and evidence of a small left subdural hematoma (4-5 millimeters in thickness). No left MCA branch occlusion is identified. Venous sinuses: Patent. Anatomic variants: None. Delayed phase: Asymmetric left convexity dural venous enhancement appears related to the small subdural hematoma. No abnormal parenchymal enhancement identified to suggest intra-axial metastatic disease. Stable gray-white matter differentiation from earlier today. Review of the MIP images confirms the above findings IMPRESSION: 1. Negative for large vessel occlusion. But positive a for 4-5 mm left convexity Subdural Hematoma, which might explain an attenuated appearance of the left MCA branches - and could be a stroke mimic for symptoms in that hemisphere. 2. Positive also for partially visible bulky tumor in the mediastinum and bilateral pulmonary metastatic disease with large layering left pleural effusion. Lung cancer including small cell carcinoma would be a leading consideration. No strong evidence of metastatic disease to the brain. 3. 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 4. 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 And up to moderate stenosis of the left ICA siphon and both distal vertebral arteries. Salient findings on this exam discussed by telephone with Dr. Joni Fears in the ED on 08/13/2018 at 15:56 .  Electronically Signed   By: Genevie Ann M.D.   On: 08/14/2018 16:04   Ct Head Wo Contrast  Addendum Date: 08/10/2018   ADDENDUM REPORT: 08/17/2018 16:01 ADDENDUM: After reviewing the subsequent CTA head neck with Dr. Nevada Crane, neuroradiologist, there is evidence of a small isodense LEFT convexity subdural hematoma on the order of 6 mm in thickness without evidence of midline shift. There is mild effacement of cortical sulci in the LEFT frontal and parietal lobes. IMPRESSION: Small subacute LEFT convexity subdural hematoma without midline shift. Electronically Signed   By: Evangeline Dakin M.D.   On: 08/11/2018 16:01   Result Date: 07/31/2018 CLINICAL DATA:  78 year old with aphasia, RIGHT-sided weakness and facial droop. Patient last seen normal at approximately 8:30 p.m. last night. Patient's family states that she fell from bed yesterday onto her wheelchair. Patient currently non-communicative. EXAM: CT HEAD WITHOUT CONTRAST TECHNIQUE: Contiguous axial images were obtained from the base of the skull through the vertex without intravenous contrast. COMPARISON:  None. FINDINGS: Brain: Severe head tilt in the  gantry accounts for the apparent asymmetry in the cerebral hemispheres. Ventricular system normal in size and appearance for age. Mild age related cortical atrophy and mild deep atrophy. Moderate to severe cerebellar atrophy. Moderate changes of small vessel disease of the white matter diffusely. No mass lesion. No midline shift. No acute hemorrhage or hematoma. No extra-axial fluid collections. No evidence of acute infarction. Vascular: Apparent prior LEFT carotid trifurcation and/or MCA aneurysm with beam hardening streak artifact arising from the aneurysm clip. Moderate to severe BILATERAL carotid siphon atherosclerosis and severe vertebrobasilar atherosclerosis. No hyperdense vessel. Skull: Prior LEFT frontotemporal craniotomy. No skull fracture or other focal osseous abnormality involving the skull.  Sinuses/Orbits: Visualized paranasal sinuses, bilateral mastoid air cells and bilateral middle ear cavities well-aerated. Visualized orbits and globes normal in appearance. Rightward gaze. Other: None. IMPRESSION: 1. No acute intracranial abnormality. 2. Mild age related cortical atrophy and moderate chronic microvascular ischemic changes of the white matter. Electronically Signed: By: Evangeline Dakin M.D. On: 08/11/2018 14:54   Ct Angio Neck W And/or Wo Contrast  Result Date: 08/17/2018 CLINICAL DATA:  78 year old female with right side weakness, facial droop and loss of speech. Last known well 2030 hours yesterday. EXAM: CT ANGIOGRAPHY HEAD AND NECK TECHNIQUE: Multidetector CT imaging of the head and neck was performed using the standard protocol during bolus administration of intravenous contrast. Multiplanar CT image reconstructions and MIPs were obtained to evaluate the vascular anatomy. Carotid stenosis measurements (when applicable) are obtained utilizing NASCET criteria, using the distal internal carotid diameter as the denominator. CONTRAST:  61mL OMNIPAQUE IOHEXOL 350 MG/ML SOLN COMPARISON:  Head CT without contrast 1439 hours. FINDINGS: CTA NECK Skeleton: Degenerative changes in the cervical spine. No destructive osseous lesion or acute osseous abnormality identified. Previous left frontotemporal craniotomy appears related to a left ICA terminus region aneurysm clipping. Upper chest: Abnormal. Partially visible bulky soft tissue tumor in the bilateral mediastinum. Anterior superior tumor on the left measures greater than 7 centimeters largest dimension. Tumor encases the right upper lobe pulmonary artery. Multifocal rounded and lobular bilateral pulmonary nodules suggest metastatic disease, and there is a dominant 3.8 centimeter mass in the posterosuperior right upper lobe. Superimposed moderate to large layering left pleural effusion. Other neck: Asymmetric hyperenhancement of the right  submandibular gland is of unclear etiology, but there is no suspicious neck mass or lymphadenopathy identified. Aortic arch: 3 vessel arch configuration. Mild arch atherosclerosis. Right carotid system: Tortuous brachiocephalic artery and proximal right CCA. Mild for age right CCA atherosclerosis. Right ICA origin and bulb soft and calcified plaque resulting in less than 50 % stenosis with respect to the distal vessel. Left carotid system: No left CCA origin stenosis. Mild left CCA atherosclerosis without stenosis. Soft and calcified plaque in the left ICA origin resulting in 60 % stenosis with respect to the distal vessel best seen on series 10, image 108. No additional stenosis in the neck distal to the bulb. Vertebral arteries: Tortuous proximal right subclavian artery without plaque or stenosis. Normal right vertebral artery origin. Patent right vertebral artery to the skull base without stenosis. Mild proximal left subclavian and left vertebral artery origin atherosclerosis. Mild left vertebral artery origin stenosis. Tortuous left V1 segment. Patent left vertebral artery to the skull base without additional stenosis. CTA HEAD Posterior circulation: Bilateral V4 segment calcified plaque. Mild to moderate distal vertebral artery stenosis bilaterally with patent vertebrobasilar junction. Proximal basilar bulky calcified plaque but only mild stenosis. Patent SCA origins. Normal right PCA origin. The left PCA origin is  mildly obscured by streak artifact from the left ICA terminus vascular clip. Still, there is evidence of severe left PCA distal P1 and proximal P2 segment stenosis best seen on series 11, image 20. There is bilateral distal PCA enhancement. Posterior communicating arteries are diminutive or absent. Anterior circulation:  Both ICA siphons are patent. On the right there is moderate calcified plaque with moderate to severe supraclinoid stenosis. There is a superimposed 7 x 7 millimeter saccular aneurysm  of the right ICA terminus directed posteriorly and slightly inferiorly (right PCOM region). The right MCA and ACA origins remain normal. On the left there is moderate calcified plaque resulting in mild to moderate left siphon stenosis. There is a distal left ICA aneurysm clip with no aneurysmal enhancement identified. The left ICA terminus remains patent. The left MCA and ACA origins appear within normal limits. Bilateral ACA branches are within normal limits. Anterior communicating artery is diminutive or absent. Right MCA M1 segment, bifurcation, and right MCA branches are within normal limits. Left MCA M1 segment and left MCA branches appear patent but attenuated with asymmetric appearing left dural venous enhancement and evidence of a small left subdural hematoma (4-5 millimeters in thickness). No left MCA branch occlusion is identified. Venous sinuses: Patent. Anatomic variants: None. Delayed phase: Asymmetric left convexity dural venous enhancement appears related to the small subdural hematoma. No abnormal parenchymal enhancement identified to suggest intra-axial metastatic disease. Stable gray-white matter differentiation from earlier today. Review of the MIP images confirms the above findings IMPRESSION: 1. Negative for large vessel occlusion. But positive a for 4-5 mm left convexity Subdural Hematoma, which might explain an attenuated appearance of the left MCA branches - and could be a stroke mimic for symptoms in that hemisphere. 2. Positive also for partially visible bulky tumor in the mediastinum and bilateral pulmonary metastatic disease with large layering left pleural effusion. Lung cancer including small cell carcinoma would be a leading consideration. No strong evidence of metastatic disease to the brain. 3. 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  4. 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 And up to moderate stenosis of the left ICA siphon and both distal vertebral arteries. Salient findings on this exam discussed by telephone with Dr. Joni Fears in the ED on 08/23/2018 at 15:56 . Electronically Signed   By: Genevie Ann M.D.   On: 08/19/2018 16:04   US Carotid Bilateral (at [facility] And [facility] Only)  Result Date: August 18, 2018 CLINICAL DATA:  Acute CVA EXAM: BILATERAL CAROTID DUPLEX ULTRASOUND TECHNIQUE: Pearline Cables scale imaging, color Doppler and duplex ultrasound were performed of bilateral carotid and vertebral arteries in the neck. COMPARISON:  None. FINDINGS: Criteria: Quantification of carotid stenosis is based on velocity parameters that correlate the residual internal carotid diameter with NASCET-based stenosis levels, using the diameter of the distal internal carotid lumen as the denominator for stenosis measurement. The following velocity measurements were obtained: RIGHT ICA: 96 cm/sec CCA: 64 cm/sec SYSTOLIC ICA/CCA RATIO:  1.5 ECA: 50 cm/sec LEFT ICA: 91 cm/sec CCA: 67 cm/sec SYSTOLIC ICA/CCA RATIO:  1.4 ECA: 60 cm/sec RIGHT CAROTID ARTERY: Moderate irregular calcified plaque in the bulb. Low resistance internal carotid Doppler pattern. RIGHT VERTEBRAL ARTERY:  Antegrade. LEFT CAROTID ARTERY: Mild smooth  calcified plaque in the bulb. Low resistance internal carotid Doppler pattern is preserved. LEFT VERTEBRAL ARTERY:  Antegrade. IMPRESSION: Less than 50% stenosis in the right and left internal carotid arteries. Electronically Signed   By: Marybelle Killings M.D.   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  PLAN:    1. 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  This is highly suspicious for underlying malignancy, most likely lung primary.  Patient will require a dedicated chest CT with contrast in the near future to further delineate the extent of her disease.  She also would likely require biopsy to obtain a diagnosis.  No intervention is needed at this time.  Given the acuity of her condition and decreased performance status, any additional imaging or procedures can be accomplished as an outpatient if patient recovers from her CVA.  If patient does fully recover, treatment would be difficult difficult given her decreased performance status.  Palliative care has been consulted to discuss with family goals of care.  Appreciate consult, will follow.  Lloyd Huger, MD   08/18/18 1:38 PM

## 2018-08-30 NOTE — Progress Notes (Addendum)
Initial Nutrition Assessment  DOCUMENTATION CODES:      INTERVENTION:   Will continue to monitor once pt is appropriate for swallow eval by SLP  Will provide appropriate intervention once diet is advanced.   Pt at refeeding risk. Monitor K, Phos and Mag when diet advances.   NUTRITION DIAGNOSIS:   Severe Malnutrition related to chronic illness(DM and mediastinal mass concern with malignancy) as evidenced by severe muscle depletion, severe fat depletion, percent weight loss of 9.4% over 3 months.   GOAL:   Patient will meet greater than or equal to 90% of their needs   MONITOR:   PO intake, Diet advancement, Labs, Weight trends, Skin  REASON FOR ASSESSMENT:   Malnutrition Screening Tool    ASSESSMENT:   78 y.o female with PMH: DM, HTN, HLD. Pt admited with right side weakness, facial droop and aphasia related to subdural hematoma.    Mediastinal mass, concern with malignancy.   Pt was unresponsive when DI entered the room. Daughter was at bedside and discussed history with me.   Daughter reported that pt has lost 30# over the past 6 months and not eating hardly anything due to her poor appetite. Daughter stated that pt lives with a friend that cooks meals for her.   Daughter reported UBW of 185# about 6 months ago. Per chart, limited weights 12/10 90.86# and 9/13 104.72#. This is a significant weight loss of 9.9# (9.4%) in < 3 months.   Pt's nutrition focused exam showed severe muscle and fat depletions.   Pt at refeeding risk. Monitor K, Phos, Mag when diet advances.   Medications reviewed and include:  Ferrous Sulfate 325 mg  Labs reviewed: Iron 22 (L)  TIBC 152 (L)  Ferritin 225 (L)  Calcium 8.3 (L)  BUN 31 (H)  Hemoglobin 8.6 (L)  HCT 28.5 (L)   NUTRITION - FOCUSED PHYSICAL EXAM:    Most Recent Value  Orbital Region  Severe depletion  Upper Arm Region  Severe depletion  Thoracic and Lumbar Region  Severe depletion  Buccal Region  Severe depletion   Temple Region  Severe depletion  Clavicle Bone Region  Severe depletion  Clavicle and Acromion Bone Region  Severe depletion  Scapular Bone Region  Moderate depletion  Dorsal Hand  Severe depletion  Patellar Region  Severe depletion  Anterior Thigh Region  Severe depletion  Posterior Calf Region  Severe depletion  Edema (RD Assessment)  None [moderate edema on RUE ]  Hair  Reviewed  Eyes  Unable to assess  Mouth  Unable to assess  Skin  Reviewed [ecchymosis on BUE]  Nails  Reviewed       Diet Order:   Diet Order            Diet NPO time specified  Diet effective now              EDUCATION NEEDS:   Not appropriate for education at this time  Skin:  Skin Assessment: Reviewed RN Assessment(Stage 2 Coccyx)  Last BM:  unknown  Height:   Ht Readings from Last 1 Encounters:  08/07/2018 5\' 3"  (1.6 m)    Weight:   Wt Readings from Last 1 Encounters:  08/01/2018 43.1 kg    Ideal Body Weight:  52.7 kg  BMI:  Body mass index is 16.83 kg/m.  Estimated Nutritional Needs:   Kcal:  1200-1300 kcal/d  Protein:  55-65 g/d  Fluid:  >1.2 L/d    Mauricia Area, MS, Dietetic Intern Pager: [phone] After  hours Pager: [phone]

## 2018-08-30 NOTE — Progress Notes (Signed)
eeg completed ° °

## 2018-08-30 NOTE — Progress Notes (Signed)
PT Cancellation Note  Patient Details Name: Michaela Dunn MRN: [id number] DOB: 1940-11-30   Cancelled Treatment:    Reason Eval/Treat Not Completed: Medical issues which prohibited therapy.  Per Neurosurgery note: recommending further neurology follow up for evaluation for seizure, TIA, or stroke.  Will hold PT until neurology has rounded on pt and established a POC.    Collie Siad PT, DPT 08-15-18, 8:41 AM

## 2018-08-30 NOTE — Consult Note (Signed)
[facility] Nurse wound consult note Reason for Consult: Patient with pressure injury POA, Stage 2. Recently healed pressure injury (full thickness, I.e., Stage 3 or 4) to the right hip.  Three family members are present for my assessment. Wound type: Pressure Pressure Injury POA: Yes Measurement: 1.2cm x 1cm x 0.2cm. There is a recently reepithelialized area located from 2-3 o'clock measuring 0.5cm x 0.8cm. This is pink; pigmentation has not yet returned. Wound bed: Pale pink, open wound edge. Drainage (amount, consistency, odor) scant serous Periwound: intact and as noted above. Family has been moisturizing with Aquaphor ointment. Dressing procedure/placement/frequency: I will continue the turning and repositioning efforts the Nursing staff has implemented, add a small silver hydrofiber dressing to the wound for the sole purpose of the donated antimicrobial property to the coccygeal wound bed and top with a silicone foam. To the healed full thickness wound on the right hip, I will ask Nursing to cover this area with a silicone foam as the patient has only one turning surface unaffected by pressure and will have to lie on this right side. Both ankles are noted to be slightly edematous, but the heels are intact. I will nevertheless add pressure redistribution heel boots to float her heels consistently. POC discussed with family and all are in agreement with the plan.  [place] nursing team will not follow, but will remain available to this patient, the nursing and medical teams.  Please re-consult if needed. Thanks, Maudie Flakes, MSN, RN, [place], [facility]  Pager# [phone]

## 2018-08-30 NOTE — Clinical Social Work Note (Signed)
CSW consulted for SNF placement. Physical therapy has not been able to see patient yet due to neuro work up. CSW will follow and wait for PT consult before determining disposition.   [place], [place]

## 2018-08-30 DEATH — deceased

## 2019-11-08 IMAGING — CT CT HEAD W/O CM
3 series · 15 of 47 positions shown, 18 images · non-contrast
Comparison: CTA head neck 08/08/2018

CLINICAL DATA: Hemorrhage follow up

EXAM:
CT HEAD WITHOUT CONTRAST
TECHNIQUE: Contiguous axial images were obtained from the base of the skull
through the vertex without intravenous contrast.

[Series 2: head wo · axial · 0.45mm/px · z∈[+175,+300]mm · 9 of 30 slices shown, 12 images]
[im 3/30  brain]
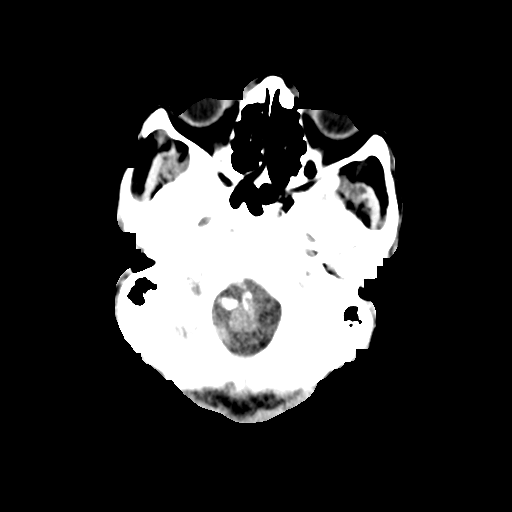
[im 3/30  bone]
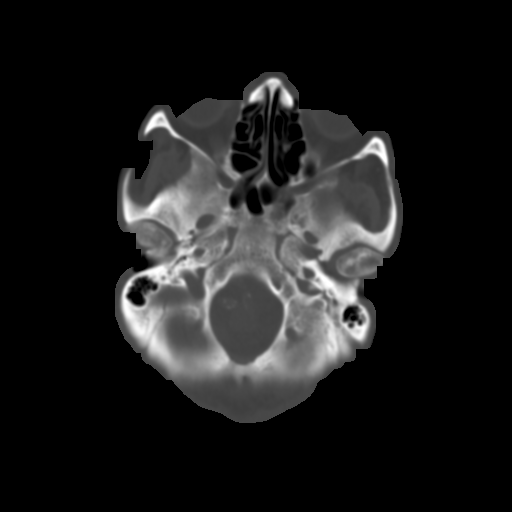
[im 6/30  brain]
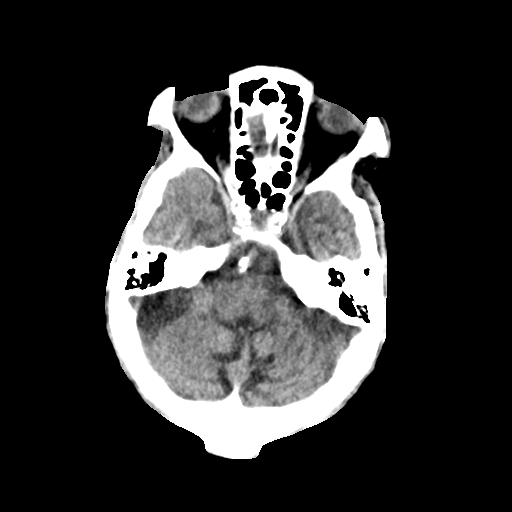
[im 9/30  brain]
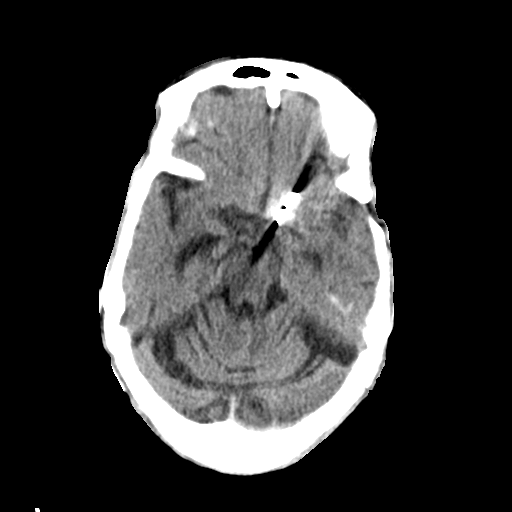
[im 12/30  brain]
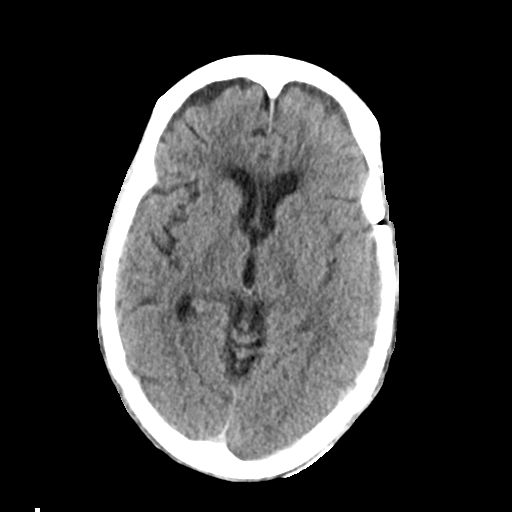
[im 16/30  brain]
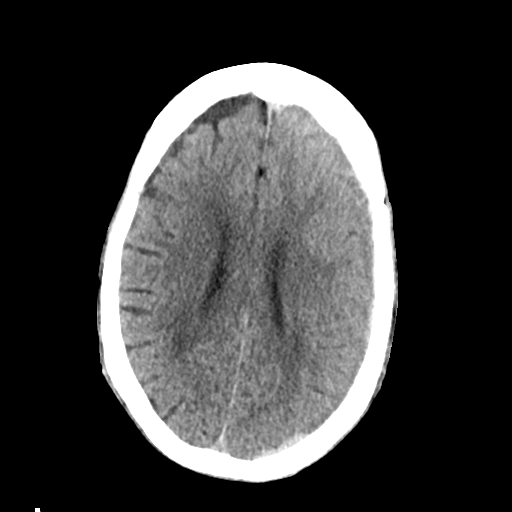
[im 16/30  bone]
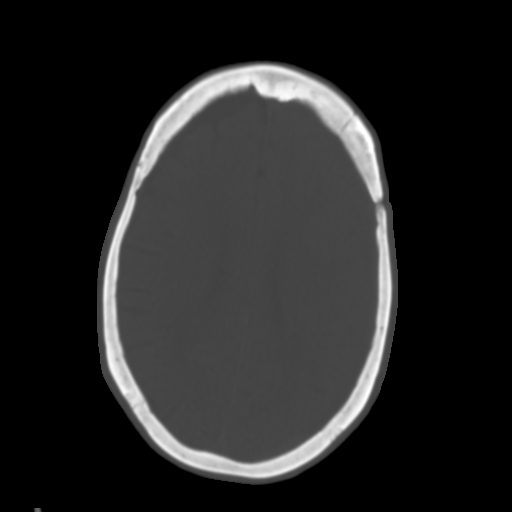
[im 19/30  brain]
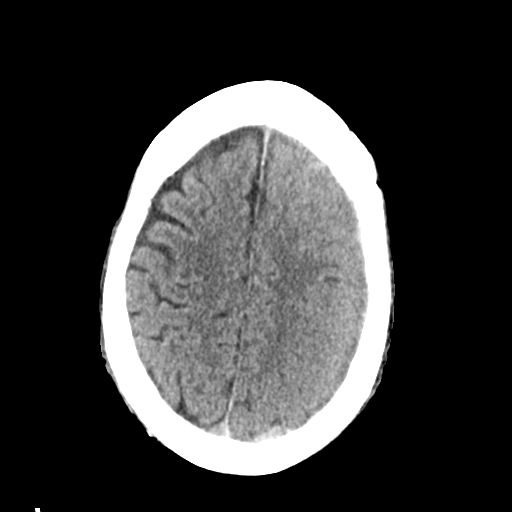
[im 22/30  brain]
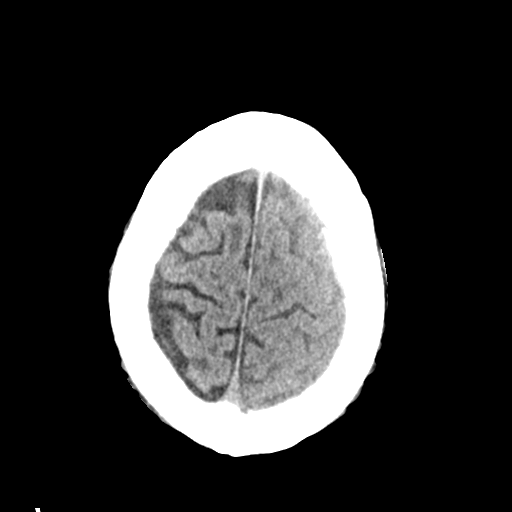
[im 25/30  brain]
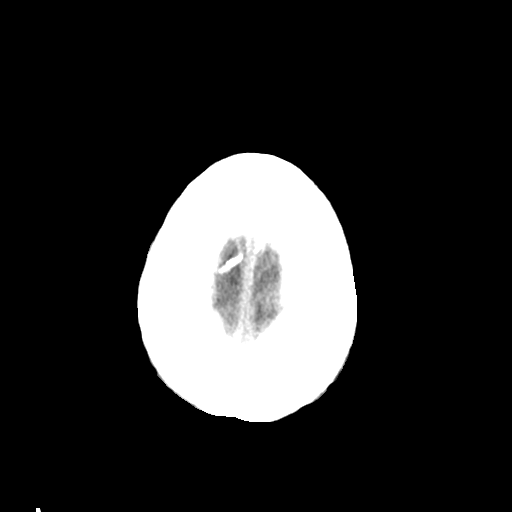
[im 28/30  brain]
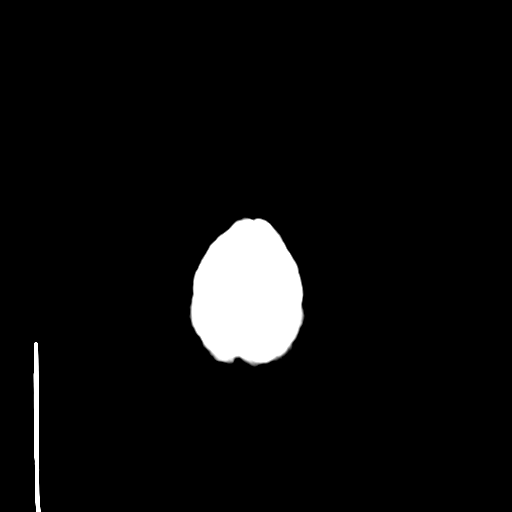
[im 28/30  bone]
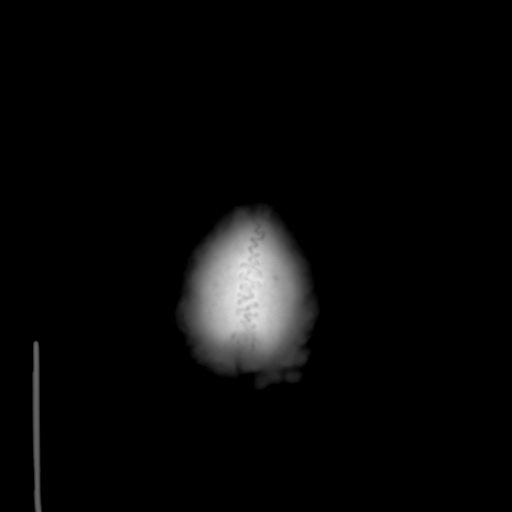

[Series 4: coronal soft tissue · coronal · 0.29mm/px · 3 of 64 slices shown]
[im 22/64  brain]
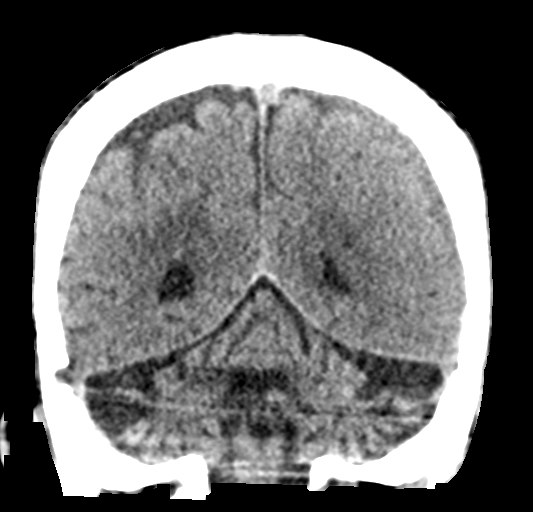
[im 29/64  brain]
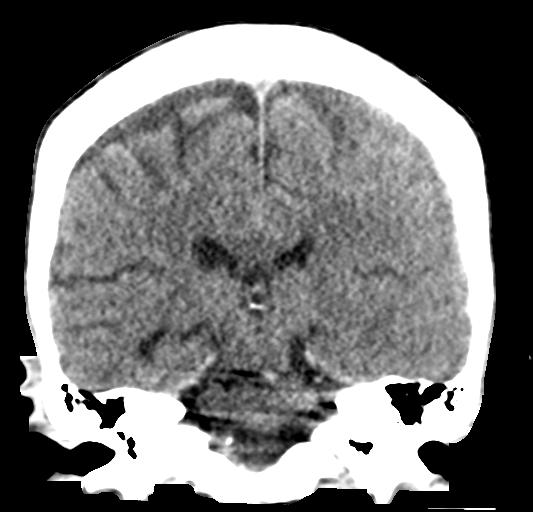
[im 36/64  brain]
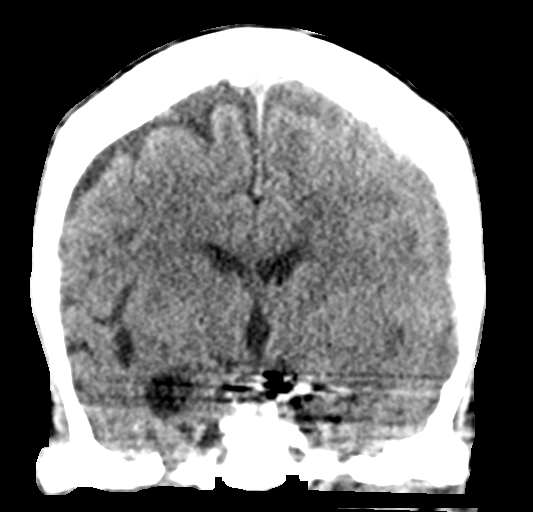

[Series 5: sagittal soft tissue · sagittal · 0.27mm/px · 3 of 48 slices shown]
[im 16/48  brain]
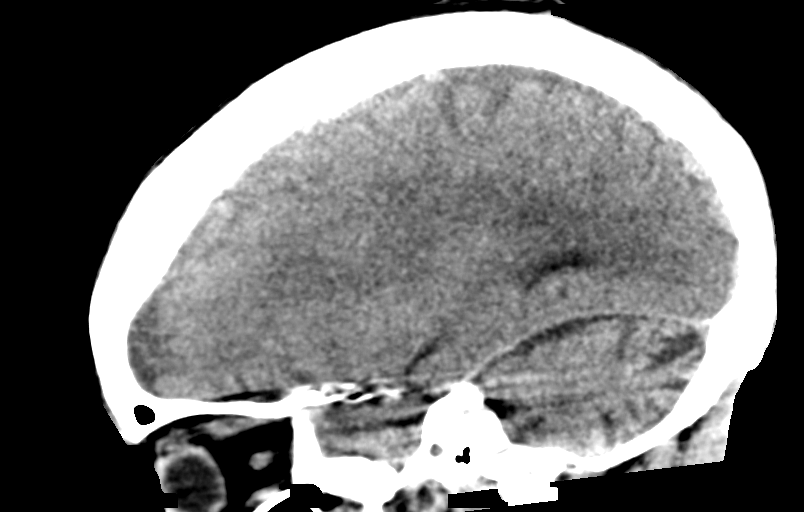
[im 24/48  brain]
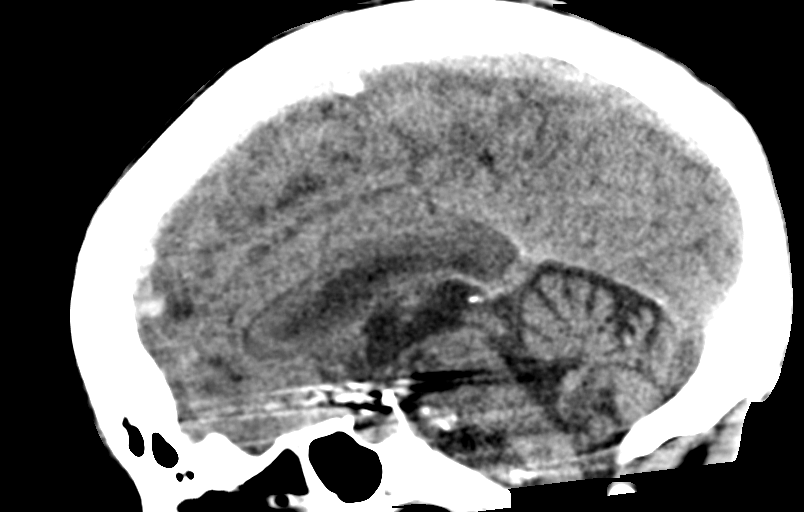
[im 32/48  brain]
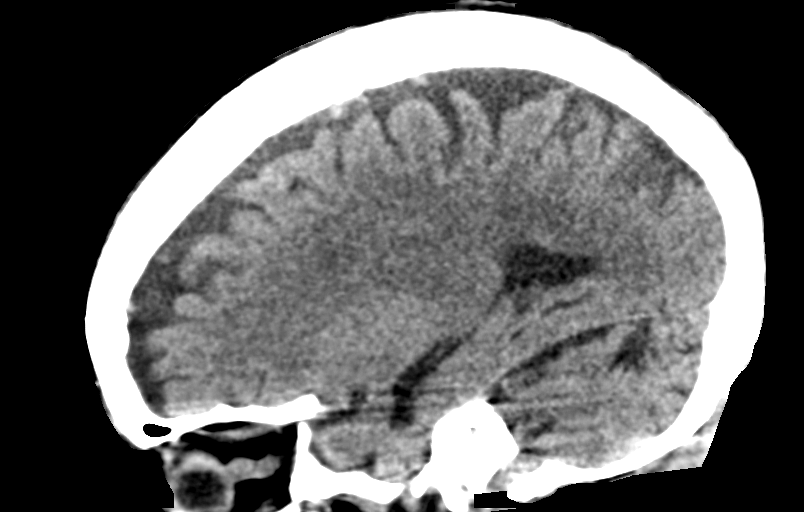

[15 of 47 positions shown; findings below may reference images not displayed]

FINDINGS: Brain: Unchanged 7 mm left convexity subdural hematoma. No mass
effect or midline shift. Size and configuration of the ventricles
are unchanged. No new site of hemorrhage. White matter findings of
chronic microvascular disease. No evidence of acute cortical
infarct.

Vascular: Previous clipping of left ICA aneurysm.

Skull: Remote left pterional craniotomy.

Sinuses/Orbits: No fluid levels or advanced mucosal thickening of
the visualized paranasal sinuses. No mastoid or middle ear effusion.
The orbits are normal.
IMPRESSION: Unchanged 7 mm left convexity subdural hematoma without mass effect
or midline shift.
